# Patient Record
Sex: Female | Born: 1990 | Race: Black or African American | Hispanic: No | Marital: Single | Smoking: Former smoker
Health system: Southern US, Community
[De-identification: ages and names within clinical notes are randomized; demographics above are authoritative.]

## PROBLEM LIST (undated history)

## (undated) ENCOUNTER — Inpatient Hospital Stay (HOSPITAL_COMMUNITY): Payer: Self-pay

## (undated) DIAGNOSIS — Z789 Other specified health status: Secondary | ICD-10-CM

## (undated) HISTORY — PX: NO PAST SURGERIES: SHX2092

---

## 2016-12-21 ENCOUNTER — Encounter (HOSPITAL_COMMUNITY): Payer: Self-pay

## 2016-12-21 DIAGNOSIS — F17290 Nicotine dependence, other tobacco product, uncomplicated: Secondary | ICD-10-CM | POA: Insufficient documentation

## 2016-12-21 DIAGNOSIS — R1011 Right upper quadrant pain: Secondary | ICD-10-CM | POA: Insufficient documentation

## 2016-12-21 DIAGNOSIS — Z79899 Other long term (current) drug therapy: Secondary | ICD-10-CM | POA: Insufficient documentation

## 2016-12-21 NOTE — ED Triage Notes (Signed)
Pt endorses intermittent n/v and RLQ abd pain since the beginning of January. Pt denies any medical hx and does not take any medications. VSS.

## 2016-12-22 ENCOUNTER — Emergency Department (HOSPITAL_COMMUNITY)
Admission: EM | Admit: 2016-12-22 | Discharge: 2016-12-22 | Disposition: A | Discharge status: Home / Self Care | Payer: Self-pay | Attending: Emergency Medicine | Admitting: Emergency Medicine

## 2016-12-22 ENCOUNTER — Emergency Department (HOSPITAL_COMMUNITY): Payer: Self-pay

## 2016-12-22 DIAGNOSIS — R1011 Right upper quadrant pain: Secondary | ICD-10-CM

## 2016-12-22 LAB — URINALYSIS, ROUTINE W REFLEX MICROSCOPIC
BACTERIA UA: NONE SEEN
BILIRUBIN URINE: NEGATIVE
Glucose, UA: NEGATIVE mg/dL
KETONES UR: NEGATIVE mg/dL
LEUKOCYTES UA: NEGATIVE
Nitrite: NEGATIVE
Protein, ur: NEGATIVE mg/dL
Specific Gravity, Urine: 1.024 (ref 1.005–1.030)
pH: 6 (ref 5.0–8.0)

## 2016-12-22 LAB — CBC
HEMATOCRIT: 32.1 % — AB (ref 36.0–46.0)
Hemoglobin: 10.1 g/dL — ABNORMAL LOW (ref 12.0–15.0)
MCH: 23.9 pg — AB (ref 26.0–34.0)
MCHC: 31.5 g/dL (ref 30.0–36.0)
MCV: 75.9 fL — AB (ref 78.0–100.0)
PLATELETS: 261 10*3/uL (ref 150–400)
RBC: 4.23 MIL/uL (ref 3.87–5.11)
RDW: 16.8 % — ABNORMAL HIGH (ref 11.5–15.5)
WBC: 7.7 10*3/uL (ref 4.0–10.5)

## 2016-12-22 LAB — WET PREP, GENITAL
Sperm: NONE SEEN
TRICH WET PREP: NONE SEEN
YEAST WET PREP: NONE SEEN

## 2016-12-22 LAB — COMPREHENSIVE METABOLIC PANEL
ALBUMIN: 3.3 g/dL — AB (ref 3.5–5.0)
ALK PHOS: 82 U/L (ref 38–126)
ALT: 16 U/L (ref 14–54)
AST: 21 U/L (ref 15–41)
Anion gap: 9 (ref 5–15)
BUN: 7 mg/dL (ref 6–20)
CHLORIDE: 103 mmol/L (ref 101–111)
CO2: 25 mmol/L (ref 22–32)
CREATININE: 0.59 mg/dL (ref 0.44–1.00)
Calcium: 8.4 mg/dL — ABNORMAL LOW (ref 8.9–10.3)
Glucose, Bld: 113 mg/dL — ABNORMAL HIGH (ref 65–99)
POTASSIUM: 3.6 mmol/L (ref 3.5–5.1)
Sodium: 137 mmol/L (ref 135–145)
Total Bilirubin: 0.5 mg/dL (ref 0.3–1.2)
Total Protein: 6.1 g/dL — ABNORMAL LOW (ref 6.5–8.1)

## 2016-12-22 LAB — LIPASE, BLOOD: Lipase: 15 U/L (ref 11–51)

## 2016-12-22 LAB — HCG, QUANTITATIVE, PREGNANCY

## 2016-12-22 NOTE — ED Notes (Signed)
Pelvic cart setup for exam 

## 2016-12-22 NOTE — ED Notes (Signed)
Pt report abd pain and nausea since the beginning of January. Pt reports that she doesn't have time to go to a primary doctor for evaluation. Pt reports coming in tonight because she was sick of the issues and just wanted to get an ultrasound to see what is going on.

## 2016-12-22 NOTE — ED Provider Notes (Signed)
[facility]-EM[facility]ENCY DEPT Provider Note   CSN: [id number] Arrival date & time: 12/21/16  2333  By signing my name below, I, Octavia Heir, attest that this documentation has been prepared under the direction and in the presence of Tomasita Crumble, MD.  Electronically Signed: Octavia Heir, ED Scribe. 12/22/16. 3:07 AM.    History   Chief Complaint Chief Complaint  Patient presents with  . Abdominal Pain  . Emesis   The history is provided by the patient. No language interpreter was used.   Marland KitchenHPI Comments: Judy Collins is a 26 y.o. female who presents to the Emergency Department complaining of moderate, intermittent, LLQ abdominal pain for the past 3 months. She reports associated nausea, vomiting, and constipation. Pt has not been evaluated for her abdominal pain in the past. She has not taken any medication to alleviate her pain. Pt has been having normal menstrual cycles since January. Pt has no PMhx of fibroids or ovarian cysts. She denies diarrhea, hematuria, dysuria, and vaginal discharge.  History reviewed. No pertinent past medical history.  There are no active problems to display for this patient.   History reviewed. No pertinent surgical history.  OB History    No data available       Home Medications    Prior to Admission medications   Not on File    Family History History reviewed. No pertinent family history.  Social History Social History  Substance Use Topics  . Smoking status: Current Some Day Smoker    Types: Cigars  . Smokeless tobacco: Never Used  . Alcohol use Yes     Comment: occ     Allergies   Patient has no known allergies.   Review of Systems Review of Systems  A complete 10 system review of systems was obtained and all systems are negative except as noted in the HPI and PMH.   Physical Exam Updated Vital Signs BP 119/61 (BP Location: Right Arm)   Pulse 69   Temp 98 F (36.7 C) (Oral)   Resp 18   Ht  (1.702 m)   Wt 245 lb  (111.1 kg)   LMP 12/17/2016 (Exact Date)   SpO2 100%   BMI 38.37 kg/m   Physical Exam  Constitutional: She is oriented to person, place, and time. She appears well-developed and well-nourished. No distress.  HENT:  Head: Normocephalic and atraumatic.  Nose: Nose normal.  Mouth/Throat: Oropharynx is clear and moist. No oropharyngeal exudate.  Eyes: Conjunctivae and EOM are normal. Pupils are equal, round, and reactive to light. No scleral icterus.  Neck: Normal range of motion. Neck supple. No JVD present. No tracheal deviation present. No thyromegaly present.  Cardiovascular: Normal rate, regular rhythm and normal heart sounds.  Exam reveals no gallop and no friction rub.   No murmur heard. Pulmonary/Chest: Effort normal and breath sounds normal. No respiratory distress. She has no wheezes. She exhibits no tenderness.  Abdominal: Soft. Bowel sounds are normal. She exhibits no distension and no mass. There is tenderness (LLQ is TTP). There is no rebound and no guarding.  Musculoskeletal: Normal range of motion. She exhibits no edema or tenderness.  Lymphadenopathy:    She has no cervical adenopathy.  Neurological: She is alert and oriented to person, place, and time. No cranial nerve deficit. She exhibits normal muscle tone.  Skin: Skin is warm and dry. No rash noted. No erythema. No pallor.  Nursing note and vitals reviewed.    ED Treatments / Results  DIAGNOSTIC STUDIES: Oxygen  Saturation is 100% on RA, normal by my interpretation.  COORDINATION OF CARE:  2:54 AM Discussed treatment plan with pt at bedside and pt agreed to plan.  Labs (all labs ordered are listed, but only abnormal results are displayed) Labs Reviewed  WET PREP, GENITAL - Abnormal; Notable for the following:       Result Value   Clue Cells Wet Prep HPF POC PRESENT (*)    WBC, Wet Prep HPF POC MANY (*)    All other components within normal limits  COMPREHENSIVE METABOLIC PANEL - Abnormal; Notable for the  following:    Glucose, Bld 113 (*)    Calcium 8.4 (*)    Total Protein 6.1 (*)    Albumin 3.3 (*)    All other components within normal limits  CBC - Abnormal; Notable for the following:    Hemoglobin 10.1 (*)    HCT 32.1 (*)    MCV 75.9 (*)    MCH 23.9 (*)    RDW 16.8 (*)    All other components within normal limits  URINALYSIS, ROUTINE W REFLEX MICROSCOPIC - Abnormal; Notable for the following:    APPearance HAZY (*)    Hgb urine dipstick MODERATE (*)    Squamous Epithelial / LPF 0-5 (*)    All other components within normal limits  LIPASE, BLOOD  HCG, QUANTITATIVE, PREGNANCY  GC/CHLAMYDIA PROBE AMP ([place]) NOT AT [facility]    EKG  EKG Interpretation None       Radiology US Abdomen Limited Ruq  Result Date: 12/22/2016 CLINICAL DATA:  Right upper quadrant abdominal pain. Pain for 3 months. EXAM: US ABDOMEN LIMITED - RIGHT UPPER QUADRANT COMPARISON:  None. FINDINGS: Gallbladder: Physiologically distended. No gallstones or wall thickening visualized. No sonographic Murphy sign noted by sonographer. Diffuse right upper quadrant tenderness on scanning, not localized to the gallbladder. Common bile duct: Diameter: 3.2 mm. Liver: No focal lesion identified. Borderline mild diffuse increase in parenchymal echogenicity. Normal directional flow in the main portal vein. IMPRESSION: 1. Normal sonographic appearance of the gallbladder and biliary tree. No gallstones. 2. Borderline mild hepatic steatosis. Electronically Signed   By: Rubye Oaks M.D.   On: 12/22/2016 06:23    Procedures Procedures (including critical care time)  Medications Ordered in ED Medications - No data to display   Initial Impression / Assessment and Plan / ED Course  I have reviewed the triage vital signs and the nursing notes.  Pertinent labs & imaging results that were available during my care of the patient were reviewed by me and considered in my medical decision making (see chart for details).       Patient presents to the emergency department for abdominal pain. She has not sought treatment for this in the past. Laboratory studies are unremarkable. Pelvic exam does not reveal any discharge, no CMT, no adnexal tenderness. Exam is normal. She states her pain is in the left lower quadrant but now states that it is right upper quadrant. She states is worse with food now. Will obtain right upper quadrant ultrasound to evaluate for cholelithiasis.   6:53 AM ultrasound is negative for any gallbladder pathology. Patient continues to appear well and in no acute distress. She is advised to follow-up with primary care physician regarding her abdominal pain for the past 3 months. She douches consisting of this plan. Vital signs were within her normal limits. Patient is safe for discharge. Final Clinical Impressions(s) / ED Diagnoses   Final diagnoses:  RUQ abdominal pain  Right upper quadrant abdominal pain   I personally performed the services described in this documentation, which was scribed in my presence. The recorded information has been reviewed and is accurate.    New Prescriptions New Prescriptions   No medications on file     Tomasita Crumble, MD 12/22/16 [phone]

## 2016-12-24 LAB — GC/CHLAMYDIA PROBE AMP (~~LOC~~) NOT AT ARMC
CHLAMYDIA, DNA PROBE: NEGATIVE
NEISSERIA GONORRHEA: NEGATIVE

## 2017-09-24 NOTE — L&D Delivery Note (Addendum)
Patient is 27 y.o. G1P0 [redacted]w[redacted]d admitted for IOL for gHTN   Delivery Note At 12:57 PM a viable female was delivered via Vaginal, Spontaneous (Presentation: cephalic; ROA ).  APGAR: 9, 9; weight pending.   Placenta status: spontaneous, intact.  Cord: 3VC    Anesthesia:  epidural Episiotomy: None Lacerations: 2nd degree perineal, bilateral first degree periurethral Suture Repair: 2.0 3.0 vicryl rapide Est. Blood Loss (mL): 546  Mom to postpartum.  Baby to Couplet care / Skin to Skin.  Upon arrival patient was complete and pushing. FHT was noted to have repetitive variable decels. She pushed with good maternal effort to deliver a healthy baby girl. Baby delivered without difficulty, was noted to have good tone and place on maternal abdomen for oral suctioning, drying and stimulation. Delayed cord clamping performed. Placenta delivered intact with 3V cord. Vaginal canal and perineum was inspected and found to have bilateral first degree periurethral lacerations and a second degree perineal laceration all three lacerations required suture repair and were hemostatic after repair. Pitocin was started and uterus massaged until bleeding slowed. Counts of sharps, instruments, and lap pads were all correct.   Mirian Mo, MD PGY-1 9/6/20191:55 PM  Midwife attestation: I was gloved and present for delivery in its entirety and I agree with the above resident's note.  Donette Larry, CNM 3:02 PM

## 2017-12-04 ENCOUNTER — Other Ambulatory Visit: Payer: Self-pay

## 2017-12-04 ENCOUNTER — Inpatient Hospital Stay (HOSPITAL_COMMUNITY)
Admission: AD | Admit: 2017-12-04 | Discharge: 2017-12-04 | Disposition: A | Discharge status: Home / Self Care | Payer: Medicaid Other | Source: Ambulatory Visit | Attending: Obstetrics & Gynecology | Admitting: Obstetrics & Gynecology

## 2017-12-04 ENCOUNTER — Encounter (HOSPITAL_COMMUNITY): Payer: Self-pay | Admitting: *Deleted

## 2017-12-04 DIAGNOSIS — A5901 Trichomonal vulvovaginitis: Secondary | ICD-10-CM | POA: Diagnosis not present

## 2017-12-04 DIAGNOSIS — Z3A11 11 weeks gestation of pregnancy: Secondary | ICD-10-CM | POA: Diagnosis not present

## 2017-12-04 DIAGNOSIS — Z3491 Encounter for supervision of normal pregnancy, unspecified, first trimester: Secondary | ICD-10-CM

## 2017-12-04 DIAGNOSIS — O209 Hemorrhage in early pregnancy, unspecified: Secondary | ICD-10-CM | POA: Insufficient documentation

## 2017-12-04 DIAGNOSIS — Z87891 Personal history of nicotine dependence: Secondary | ICD-10-CM | POA: Diagnosis not present

## 2017-12-04 DIAGNOSIS — O23591 Infection of other part of genital tract in pregnancy, first trimester: Secondary | ICD-10-CM

## 2017-12-04 DIAGNOSIS — O98311 Other infections with a predominantly sexual mode of transmission complicating pregnancy, first trimester: Secondary | ICD-10-CM | POA: Insufficient documentation

## 2017-12-04 HISTORY — DX: Other specified health status: Z78.9

## 2017-12-04 LAB — URINALYSIS, ROUTINE W REFLEX MICROSCOPIC
BILIRUBIN URINE: NEGATIVE
GLUCOSE, UA: NEGATIVE mg/dL
Ketones, ur: NEGATIVE mg/dL
Nitrite: NEGATIVE
PROTEIN: NEGATIVE mg/dL
Specific Gravity, Urine: 1.008 (ref 1.005–1.030)
pH: 6 (ref 5.0–8.0)

## 2017-12-04 LAB — WET PREP, GENITAL
Clue Cells Wet Prep HPF POC: NONE SEEN
SPERM: NONE SEEN

## 2017-12-04 LAB — POCT PREGNANCY, URINE: PREG TEST UR: POSITIVE — AB

## 2017-12-04 LAB — ABO/RH: ABO/RH(D): O POS

## 2017-12-04 MED ORDER — METRONIDAZOLE 500 MG PO TABS: ORAL_TABLET | ORAL | 0 refills | Status: DC

## 2017-12-04 NOTE — MAU Provider Note (Signed)
Chief Complaint: Vaginal Bleeding   First Provider Initiated Contact with Patient 12/04/17 1335      SUBJECTIVE HPI: Judy Collins is a 27 y.o. G1P0 at 5353w6d by LMP who presents to maternity admissions [facility] for episodes of vaginal bleeding this week. She presented today to their office with positive HPT on 11/01/17 and bleeding x 2 episodes this week, none today. Pregnancy test was positive today at [facility].  She was sent to MAU for further evaluation of bleeding. She reports the first episode of bleeding was ~1 week ago, then it occurred again 4 days ago.  The bleeding was light, pink, and noted only when wiping. There are no other symptoms. She has not tried any treatments.  [place]. She has recently been approved for pregnancy Medicaid.  HPI  Past Medical History:  Diagnosis Date  . Medical history non-contributory    Past Surgical History:  Procedure Laterality Date  . NO PAST SURGERIES     Social History   Socioeconomic History  . Marital status: Single    Spouse name: Not on file  . Number of children: Not on file  . Years of education: Not on file  . Highest education level: Not on file  Social Needs  . Financial resource strain: Not on file  . Food insecurity - worry: Not on file  . Food insecurity - inability: Not on file  . Transportation needs - medical: Not on file  . Transportation needs - non-medical: Not on file  Occupational History  . Not on file  Tobacco Use  . Smoking status: Former Smoker    Types: Cigars  . Smokeless tobacco: Never Used  Substance and Sexual Activity  . Alcohol use: No    Frequency: Never    Comment: occ  . Drug use: No  . Sexual activity: Yes  Other Topics Concern  . Not on file  Social History Narrative  . Not on file   No current facility-administered medications on file prior to encounter.    No current outpatient medications on file prior to  encounter.   No Known Allergies  ROS:  Review of Systems  Constitutional: Negative for chills, fatigue and fever.  Respiratory: Negative for shortness of breath.   Cardiovascular: Negative for chest pain.  Gastrointestinal: Negative for nausea and vomiting.  Genitourinary: Positive for vaginal bleeding. Negative for difficulty urinating, dysuria, flank pain, pelvic pain, vaginal discharge and vaginal pain.  Neurological: Negative for dizziness and headaches.  Psychiatric/Behavioral: Negative.      I have reviewed patient's Past Medical Hx, Surgical Hx, Family Hx, Social Hx, medications and allergies.   Physical Exam   Patient Vitals for the past 24 hrs:  BP Temp Temp src Pulse Resp SpO2 Height Weight  12/04/17 1218 128/81 98.5 F (36.9 C) Oral 85 18 100 % - -  12/04/17 1213 - - - - - - 5\' 7"  (1.702 m) 276 lb 8 oz (125.4 kg)   Constitutional: Well-developed, well-nourished female in no acute distress.  Cardiovascular: normal rate Respiratory: normal effort GI: Abd soft, non-tender. Pos BS x 4 MS: Extremities nontender, no edema, normal ROM Neurologic: Alert and oriented x 4.  GU: Neg CVAT.  PELVIC EXAM: Cervix pink, visually closed, without lesion, scant white creamy discharge, vaginal walls and external genitalia normal Bimanual exam: Cervix 0/long/high, firm, anterior, neg CMT, uterus nontender, nonenlarged, adnexa without tenderness, enlargement, or mass  FHT  not heard by doppler  LAB RESULTS Results for orders placed or performed during the hospital encounter of 12/04/17 (from the past 24 hour(s))  Pregnancy, urine POC     Status: Abnormal   Collection Time: 12/04/17 12:38 PM  Result Value Ref Range   Preg Test, Ur POSITIVE (A) NEGATIVE       IMAGING Limited OB US Date: 12/04/17 EDD : 06/19/18  based on LMP Viability:  Live IUP noted.  FHT detected CRL measurement c/w LMP dates  MAU Management/MDM: Bedside US confirms IUP c/w sure LMP dates. Wet prep/GC  collected and positive for trichomonas. Pt needs to leave so Rx sent to pharmacy.  Pt declines expedited partner therapy.  Pt to f/u with prenatal care as soon as possible.  List of providers given.  Pt discharged with strict bleeding/first trimester precautions.  ASSESSMENT  1. Normal IUP (intrauterine pregnancy) on prenatal ultrasound, first trimester   2. Trichomonal vaginitis during pregnancy in first trimester   3. Vaginal bleeding in pregnancy, first trimester    PLAN Discharge home Allergies as of 12/04/2017   No Known Allergies     Medication List    You have not been prescribed any medications.      Sharen Counter Certified Nurse-Midwife 12/04/2017  1:37 PM

## 2017-12-04 NOTE — Discharge Instructions (Signed)
[facility]  ° ° °[facility]       Phone: [phone] ° °[facility] Phone: [phone] ° °[facility] [place]  Phone: [phone] ° °[facility] [place]  Phone: [phone] ° °[facility]  Phone: [phone] ° °[facility]       Phone: [phone] ° °[facility]    Phone: [phone]  ° °[facility]    Phone: [phone] ° °[facility]    Phone: [phone] ° °[facility]    Phone: [phone] ° °[facility]    Phone: [phone] ° °[facility]       Phone: [phone]  ° °[facility]  Phone: [phone] ° °[facility]    Phone: [phone] ° °[facility]   Phone: [phone] ° °[facility]      Phone: [phone] ° °[facility]    Phone: [phone] ° °

## 2017-12-04 NOTE — MAU Note (Signed)
[facility] instructed to be seen in [facility]U secondary   c/o VB x 1day (last Friday)  abdominal cramping.  +UPT @ [facility] today.  Denies current VB or cramping. LMP 09/12/2017

## 2017-12-05 LAB — GC/CHLAMYDIA PROBE AMP (~~LOC~~) NOT AT ARMC
Chlamydia: NEGATIVE
Neisseria Gonorrhea: NEGATIVE

## 2018-01-06 ENCOUNTER — Inpatient Hospital Stay (HOSPITAL_COMMUNITY)
Admission: AD | Admit: 2018-01-06 | Discharge: 2018-01-06 | Disposition: A | Discharge status: Home / Self Care | Payer: Medicaid Other | Source: Ambulatory Visit | Attending: Obstetrics & Gynecology | Admitting: Obstetrics & Gynecology

## 2018-01-06 ENCOUNTER — Encounter (HOSPITAL_COMMUNITY): Payer: Self-pay | Admitting: *Deleted

## 2018-01-06 DIAGNOSIS — Z87891 Personal history of nicotine dependence: Secondary | ICD-10-CM | POA: Insufficient documentation

## 2018-01-06 DIAGNOSIS — Z3A16 16 weeks gestation of pregnancy: Secondary | ICD-10-CM | POA: Diagnosis not present

## 2018-01-06 DIAGNOSIS — R51 Headache: Secondary | ICD-10-CM

## 2018-01-06 DIAGNOSIS — O26892 Other specified pregnancy related conditions, second trimester: Secondary | ICD-10-CM | POA: Diagnosis not present

## 2018-01-06 LAB — URINALYSIS, ROUTINE W REFLEX MICROSCOPIC
Bilirubin Urine: NEGATIVE
Glucose, UA: NEGATIVE mg/dL
Hgb urine dipstick: NEGATIVE
Ketones, ur: NEGATIVE mg/dL
LEUKOCYTES UA: NEGATIVE
NITRITE: NEGATIVE
PH: 7 (ref 5.0–8.0)
Protein, ur: NEGATIVE mg/dL
Specific Gravity, Urine: 1.017 (ref 1.005–1.030)

## 2018-01-06 MED ORDER — ACETAMINOPHEN 500 MG PO TABS
1000.0000 mg | ORAL_TABLET | Freq: Once | ORAL | Status: AC
  Administered 2018-01-06: 1000 mg via ORAL
  Filled 2018-01-06: qty 2

## 2018-01-06 NOTE — Discharge Instructions (Signed)
[facility]  ° ° °[facility]       Phone: [phone] ° °[facility] Phone: [phone] ° °[facility] [place]  Phone: [phone] ° °[facility] [place] [facility]  Phone: [phone] ° °[facility]  Phone: [phone] ° °[facility]       Phone: [phone] ° °[facility]    Phone: [phone]  ° °[facility]    Phone: [phone] ° °[facility]    Phone: [phone] ° °[facility]    Phone: [phone] ° °[facility]    Phone: [phone] ° °[facility]       Phone: [phone]  ° °[facility]  Phone: [phone] ° °[facility]    Phone: [phone] ° °[facility]   Phone: [phone] ° °[facility]      Phone: [phone] ° °[facility]    Phone: [phone] ° °Safe Medications in Pregnancy  ° °Acne: °Benzoyl Peroxide °Salicylic Acid ° °Backache/Headache: °Tylenol: 2 regular strength every 4 hours OR °             2 Extra strength every 6 hours ° °Colds/Coughs/Allergies: °Benadryl (alcohol free) 25 mg every 6 hours as needed °Breath right strips °Claritin °Cepacol throat lozenges °Chloraseptic throat spray °Cold-Eeze- up to three times per day °Cough drops, alcohol free °Flonase (by prescription only) °Guaifenesin °Mucinex °Robitussin DM (plain only, alcohol free) °Saline nasal spray/drops °Sudafed (pseudoephedrine) & Actifed ** use only after [redacted] weeks gestation and if you do not have high blood pressure °Tylenol °Vicks Vaporub °Zinc lozenges °Zyrtec  ° °Constipation: °Colace °Ducolax suppositories °Fleet enema °Glycerin suppositories °Metamucil °Milk of  magnesia °Miralax °Senokot °Smooth move tea ° °Diarrhea: °Kaopectate °Imodium A-D ° °*NO pepto Bismol ° °Hemorrhoids: °Anusol °Anusol HC °Preparation H °Tucks ° °Indigestion: °Tums °Maalox °Mylanta °Zantac  °Pepcid ° °Insomnia: °Benadryl (alcohol free) 25mg every 6 hours as needed °Tylenol PM °Unisom, no Gelcaps ° °Leg Cramps: °Tums °MagGel ° °Nausea/Vomiting:  °Bonine °Dramamine °Emetrol °Ginger extract °Sea bands °Meclizine  °Nausea medication to take during pregnancy:  °Unisom (doxylamine succinate 25 mg tablets) Take one tablet daily at bedtime. If symptoms are not adequately controlled, the dose can be increased to a maximum recommended dose of two tablets daily (1/2 tablet in the morning, 1/2 tablet mid-afternoon and one at bedtime). °Vitamin B6 100mg tablets. Take one tablet twice a day (up to 200 mg per day). ° °Skin Rashes: °Aveeno products °Benadryl cream or 25mg every 6 hours as needed °Calamine Lotion °1% cortisone cream ° °Yeast infection: °Gyne-lotrimin 7 °Monistat 7 ° ° °**If taking multiple medications, please check labels to avoid duplicating the same active ingredients °**take medication as directed on the label °** Do not exceed 4000 mg of tylenol in 24 hours °**Do not take medications that contain aspirin or ibuprofen ° ° ° ° °

## 2018-01-06 NOTE — MAU Note (Signed)
Pt here with c/o HA since about 1900; did not take anything for it because "I wasn't sure what I could take." Denies any bleeding or leaking.

## 2018-01-06 NOTE — MAU Note (Signed)
Pt reports headache since 7pm.

## 2018-01-06 NOTE — MAU Provider Note (Signed)
History     CSN: [id number]  Arrival date and time: 01/06/18 4010   First Provider Initiated Contact with Patient 01/06/18 0139      Chief Complaint  Patient presents with  . Headache   HPI Judy Collins is a 27 y.o. G1P0 at [redacted]w[redacted]d who presents with a headache. She reports a history of frequent headaches and states this one has been on and off all week. She reports the pain a 7/10 and did not try anything for the pain because "I don't know what I can take." She denies any abdominal pain or vaginal bleeding. Patient states she does not drink much water during the day.   OB History    Gravida  1   Para      Term      Preterm      AB      Living        SAB      TAB      Ectopic      Multiple      Live Births              Past Medical History:  Diagnosis Date  . Medical history non-contributory     Past Surgical History:  Procedure Laterality Date  . NO PAST SURGERIES      No family history on file.  Social History   Tobacco Use  . Smoking status: Former Smoker    Types: Cigars  . Smokeless tobacco: Never Used  Substance Use Topics  . Alcohol use: No    Frequency: Never    Comment: occ  . Drug use: No    Allergies: No Known Allergies  Medications Prior to Admission  Medication Sig Dispense Refill Last Dose  . metroNIDAZOLE (FLAGYL) 500 MG tablet Take two tablets by mouth twice a day, for one day.  Or you can take all four tablets at once if you can tolerate it. 4 tablet 0     Review of Systems  Constitutional: Negative.  Negative for fatigue and fever.  HENT: Negative.   Respiratory: Negative.  Negative for shortness of breath.   Cardiovascular: Negative.  Negative for chest pain.  Gastrointestinal: Negative.  Negative for abdominal pain, constipation, diarrhea, nausea and vomiting.  Genitourinary: Negative.  Negative for dysuria.  Neurological: Positive for headaches. Negative for dizziness.   Physical Exam   Blood pressure 140/87,  pulse 93, temperature 99.4 F (37.4 C), temperature source Oral, resp. rate 18, height 5\' 6"  (1.676 m), weight 277 lb (125.6 kg), last menstrual period 09/12/2017, SpO2 100 %.  Physical Exam  Nursing note and vitals reviewed. Constitutional: She is oriented to person, place, and time. She appears well-developed and well-nourished. No distress.  HENT:  Head: Normocephalic.  Eyes: Pupils are equal, round, and reactive to light.  Cardiovascular: Normal rate, regular rhythm and normal heart sounds.  Respiratory: Effort normal and breath sounds normal. No respiratory distress.  GI: Soft. Bowel sounds are normal. She exhibits no distension. There is no tenderness.  Neurological: She is alert and oriented to person, place, and time. She has normal reflexes. No cranial nerve deficit. Coordination normal.  Skin: Skin is warm and dry.  Psychiatric: She has a normal mood and affect. Her behavior is normal. Judgment and thought content normal.   FHT: 153 bpm  MAU Course  Procedures Results for orders placed or performed during the hospital encounter of 01/06/18 (from the past 24 hour(s))  Urinalysis, Routine w reflex microscopic  Status: None   Collection Time: 01/06/18 12:45 AM  Result Value Ref Range   Color, Urine YELLOW YELLOW   APPearance CLEAR CLEAR   Specific Gravity, Urine 1.017 1.005 - 1.030   pH 7.0 5.0 - 8.0   Glucose, UA NEGATIVE NEGATIVE mg/dL   Hgb urine dipstick NEGATIVE NEGATIVE   Bilirubin Urine NEGATIVE NEGATIVE   Ketones, ur NEGATIVE NEGATIVE mg/dL   Protein, ur NEGATIVE NEGATIVE mg/dL   Nitrite NEGATIVE NEGATIVE   Leukocytes, UA NEGATIVE NEGATIVE   MDM UA Tylenol 1000mg  PO Patient reports relief from headache  Assessment and Plan   1. Pregnancy headache in second trimester   2. [redacted] weeks gestation of pregnancy    -Discharge home in stable condition -Safe OTC medications in pregnancy list given to patient -Encouraged patient to increase fluid intake -Patient  advised to follow-up with OB of choice to start prenatal care -Patient may return to MAU as needed or if her condition were to change or worsen  Rolm BookbinderCaroline M Eduardo Wurth CNM 01/06/2018, 1:39 AM

## 2018-02-03 ENCOUNTER — Other Ambulatory Visit (HOSPITAL_COMMUNITY)
Admission: RE | Admit: 2018-02-03 | Discharge: 2018-02-03 | Disposition: A | Discharge status: Home / Self Care | Payer: Medicaid Other | Source: Ambulatory Visit | Attending: Obstetrics & Gynecology | Admitting: Obstetrics & Gynecology

## 2018-02-03 ENCOUNTER — Ambulatory Visit (INDEPENDENT_AMBULATORY_CARE_PROVIDER_SITE_OTHER): Payer: Medicaid Other | Admitting: Advanced Practice Midwife

## 2018-02-03 ENCOUNTER — Encounter: Payer: Self-pay | Admitting: Advanced Practice Midwife

## 2018-02-03 VITALS — BP 124/80 | Wt 275.0 lb

## 2018-02-03 DIAGNOSIS — B373 Candidiasis of vulva and vagina: Secondary | ICD-10-CM | POA: Insufficient documentation

## 2018-02-03 DIAGNOSIS — Z3481 Encounter for supervision of other normal pregnancy, first trimester: Secondary | ICD-10-CM

## 2018-02-03 DIAGNOSIS — Z3402 Encounter for supervision of normal first pregnancy, second trimester: Secondary | ICD-10-CM

## 2018-02-03 DIAGNOSIS — Z3400 Encounter for supervision of normal first pregnancy, unspecified trimester: Secondary | ICD-10-CM | POA: Diagnosis present

## 2018-02-03 DIAGNOSIS — B9689 Other specified bacterial agents as the cause of diseases classified elsewhere: Secondary | ICD-10-CM | POA: Insufficient documentation

## 2018-02-03 DIAGNOSIS — O99212 Obesity complicating pregnancy, second trimester: Secondary | ICD-10-CM

## 2018-02-03 DIAGNOSIS — O99210 Obesity complicating pregnancy, unspecified trimester: Secondary | ICD-10-CM

## 2018-02-03 DIAGNOSIS — E669 Obesity, unspecified: Secondary | ICD-10-CM

## 2018-02-03 NOTE — Progress Notes (Signed)
   PRENATAL VISIT NOTE  Subjective:  Judy Collins is a 27 y.o. G1P0 who is 20 weeks by unsure LMP being seen today for initial prenatal visit.  She is currently monitored for the following issues for this low-risk pregnancy and has Obesity in pregnancy and Supervision of normal first pregnancy, antepartum on their problem list.  Patient reports no complaints.  Contractions: Not present. Vag. Bleeding: None.  Movement: Present. Denies leaking of fluid.   The following portions of the patient's history were reviewed and updated as appropriate: allergies, current medications, past family history, past medical history, past social history, past surgical history and problem list. Problem list updated.  Objective:   Vitals:   02/03/18 1049  BP: 124/80  Weight: 275 lb (124.7 kg)    Fetal Status: Fetal Heart Rate (bpm): 154   Movement: Present     VS reviewed, nursing note reviewed,  Constitutional: well developed, well nourished, no distress HEENT: normocephalic CV: normal rate Pulm/chest wall: normal effort Breast Exam:  right breast normal without mass, skin or nipple changes or axillary nodes, left breast normal without mass, skin or nipple changes or axillary nodes Abdomen: soft Neuro: alert and oriented x 3 Skin: warm, dry Psych: affect normal Pelvic exam: Cervix pink, visually closed, without lesion, scant white creamy discharge, vaginal walls and external genitalia normal Bimanual exam: Cervix 0/long/high, firm, anterior, neg CMT, uterus nontender, enlarged, adnexa without tenderness, enlargement, or mass  Assessment and Plan:  Pregnancy: G1P0 at Unknown  1. Supervision of normal first pregnancy, antepartum --Discussed safety in pregnancy, anticipatory guidance given about next weeks of pregnancy/next prenatal visits. --Unsure dates, anatomy US scheduled next week - Prenatal Vit-Fe Fumarate-FA (MULTIVITAMIN-PRENATAL) 27-0.8 MG TABS tablet; Take 1 tablet by mouth daily at 12  noon. - Obstetric panel - HIV antibody (with reflex) - Cystic fibrosis diagnostic study - Sickle Cell Scr - Culture, OB Urine - [place] MFM OB COMP + 14 WK; Future - Cytology - PAP  2. Obesity in pregnancy --Recommend 10-15 lbs of weight gain  Preterm labor symptoms and general obstetric precautions including but not limited to vaginal bleeding, contractions, leaking of fluid and fetal movement were reviewed in detail with the patient. Please refer to After Visit Summary for other counseling recommendations.  No follow-ups on file.  Future Appointments  Date Time Provider Department Center  02/11/2018  3:45 PM [facility] [place] 2 [facility]  03/03/2018 11:00 AM Leftwich-Kirby, Wilmer Floor, CNM [facility]    Sharen Counter, CNM

## 2018-02-04 LAB — CYTOLOGY - PAP
BACTERIAL VAGINITIS: POSITIVE — AB
Candida vaginitis: POSITIVE — AB
Chlamydia: NEGATIVE
Diagnosis: NEGATIVE
Neisseria Gonorrhea: NEGATIVE

## 2018-02-06 ENCOUNTER — Telehealth: Payer: Self-pay

## 2018-02-06 DIAGNOSIS — B9689 Other specified bacterial agents as the cause of diseases classified elsewhere: Secondary | ICD-10-CM

## 2018-02-06 DIAGNOSIS — B379 Candidiasis, unspecified: Secondary | ICD-10-CM

## 2018-02-06 DIAGNOSIS — N760 Acute vaginitis: Secondary | ICD-10-CM

## 2018-02-06 MED ORDER — TERCONAZOLE 0.4 % VA CREA
1.0000 | TOPICAL_CREAM | Freq: Every day | VAGINAL | 0 refills | Status: DC
Start: 2018-02-06 — End: 2018-05-02

## 2018-02-06 MED ORDER — METRONIDAZOLE 500 MG PO TABS: 500.0000 mg | ORAL_TABLET | Freq: Two times a day (BID) | ORAL | 0 refills | Status: DC

## 2018-02-06 NOTE — Telephone Encounter (Signed)
Spoke with pt about anemia and to add iron supplement in addition to her prenatal vitamins per Sharen Counter, CNM. Also told her to increase PO fluids and fiber or Colace if it causes constipation. Also spoke with pt about positive yeast and BV results. Pharmacy verified. Rx sent. Will treat per protocol. Pt expressed understanding.

## 2018-02-07 LAB — CULTURE, OB URINE

## 2018-02-07 LAB — URINE CULTURE, OB REFLEX

## 2018-02-11 ENCOUNTER — Ambulatory Visit (HOSPITAL_COMMUNITY): Payer: Medicaid Other

## 2018-02-11 ENCOUNTER — Other Ambulatory Visit: Payer: Self-pay | Admitting: Advanced Practice Midwife

## 2018-02-11 ENCOUNTER — Ambulatory Visit (HOSPITAL_COMMUNITY)
Admission: RE | Admit: 2018-02-11 | Discharge: 2018-02-11 | Disposition: A | Discharge status: Home / Self Care | Payer: Medicaid Other | Source: Ambulatory Visit | Attending: Advanced Practice Midwife | Admitting: Advanced Practice Midwife

## 2018-02-11 DIAGNOSIS — O99212 Obesity complicating pregnancy, second trimester: Secondary | ICD-10-CM | POA: Insufficient documentation

## 2018-02-11 DIAGNOSIS — O0932 Supervision of pregnancy with insufficient antenatal care, second trimester: Secondary | ICD-10-CM | POA: Insufficient documentation

## 2018-02-11 DIAGNOSIS — Z3687 Encounter for antenatal screening for uncertain dates: Secondary | ICD-10-CM | POA: Insufficient documentation

## 2018-02-11 DIAGNOSIS — Z3A21 21 weeks gestation of pregnancy: Secondary | ICD-10-CM | POA: Diagnosis not present

## 2018-02-11 DIAGNOSIS — Z3492 Encounter for supervision of normal pregnancy, unspecified, second trimester: Secondary | ICD-10-CM

## 2018-02-11 DIAGNOSIS — Z3400 Encounter for supervision of normal first pregnancy, unspecified trimester: Secondary | ICD-10-CM

## 2018-02-11 DIAGNOSIS — Z363 Encounter for antenatal screening for malformations: Secondary | ICD-10-CM | POA: Insufficient documentation

## 2018-02-11 LAB — CYSTIC FIBROSIS DIAGNOSTIC STUDY

## 2018-02-11 LAB — OBSTETRIC PANEL
Antibody Screen: NOT DETECTED
Basophils Absolute: 29 cells/uL (ref 0–200)
Basophils Relative: 0.5 %
EOS PCT: 0.3 %
Eosinophils Absolute: 17 cells/uL (ref 15–500)
HCT: 29.9 % — ABNORMAL LOW (ref 35.0–45.0)
HEP B S AG: NONREACTIVE
Hemoglobin: 9.6 g/dL — ABNORMAL LOW (ref 11.7–15.5)
LYMPHS ABS: 1398 {cells}/uL (ref 850–3900)
MCH: 23.2 pg — ABNORMAL LOW (ref 27.0–33.0)
MCHC: 32.1 g/dL (ref 32.0–36.0)
MCV: 72.4 fL — AB (ref 80.0–100.0)
MPV: 11.3 fL (ref 7.5–12.5)
Monocytes Relative: 10.4 %
Neutro Abs: 3753 cells/uL (ref 1500–7800)
Neutrophils Relative %: 64.7 %
PLATELETS: 257 10*3/uL (ref 140–400)
RBC: 4.13 10*6/uL (ref 3.80–5.10)
RDW: 18.5 % — ABNORMAL HIGH (ref 11.0–15.0)
RPR Ser Ql: NONREACTIVE
Rubella: 3.67 index
Total Lymphocyte: 24.1 %
WBC mixed population: 603 cells/uL (ref 200–950)
WBC: 5.8 10*3/uL (ref 3.8–10.8)

## 2018-02-11 LAB — SICKLE CELL SCREEN: Sickle Solubility Test - HGBRFX: NEGATIVE

## 2018-02-11 LAB — HIV ANTIBODY (ROUTINE TESTING W REFLEX): HIV: NONREACTIVE

## 2018-03-03 ENCOUNTER — Encounter: Payer: Medicaid Other | Admitting: Advanced Practice Midwife

## 2018-03-06 ENCOUNTER — Ambulatory Visit (INDEPENDENT_AMBULATORY_CARE_PROVIDER_SITE_OTHER): Payer: Medicaid Other | Admitting: Obstetrics & Gynecology

## 2018-03-06 VITALS — BP 122/84 | Wt 275.0 lb

## 2018-03-06 DIAGNOSIS — Z3402 Encounter for supervision of normal first pregnancy, second trimester: Secondary | ICD-10-CM

## 2018-03-06 DIAGNOSIS — O99212 Obesity complicating pregnancy, second trimester: Secondary | ICD-10-CM

## 2018-03-06 DIAGNOSIS — Z3400 Encounter for supervision of normal first pregnancy, unspecified trimester: Secondary | ICD-10-CM

## 2018-03-06 DIAGNOSIS — O99210 Obesity complicating pregnancy, unspecified trimester: Secondary | ICD-10-CM

## 2018-03-06 NOTE — Progress Notes (Signed)
   PRENATAL VISIT NOTE  Subjective:  Judy Collins is a 27 y.o. G1P0 at 1955w0d being seen today for ongoing prenatal care.  She is currently monitored for the following issues for this low-risk pregnancy and has Obesity in pregnancy and Supervision of normal first pregnancy, antepartum on their problem list.  Patient reports no complaints.  Contractions: Not present. Vag. Bleeding: None.  Movement: Present. Denies leaking of fluid.   The following portions of the patient's history were reviewed and updated as appropriate: allergies, current medications, past family history, past medical history, past social history, past surgical history and problem list. Problem list updated.  Objective:   Vitals:   03/06/18 1539  BP: 122/84  Weight: 275 lb (124.7 kg)    Fetal Status: Fetal Heart Rate (bpm): 151   Movement: Present     General:  Alert, oriented and cooperative. Patient is in no acute distress.  Skin: Skin is warm and dry. No rash noted.   Cardiovascular: Normal heart rate noted  Respiratory: Normal respiratory effort, no problems with respiration noted  Abdomen: Soft, gravid, appropriate for gestational age.  Pain/Pressure: Absent     Pelvic: Cervical exam deferred        Extremities: Normal range of motion.  Edema: None  Mental Status: Normal mood and affect. Normal behavior. Normal judgment and thought content.   Assessment and Plan:  Pregnancy: G1P0 at 2655w0d  1. Obesity in pregnancy  - Hemoglobin A1c  2. Supervision of normal first pregnancy, antepartum rec iron daily  Preterm labor symptoms and general obstetric precautions including but not limited to vaginal bleeding, contractions, leaking of fluid and fetal movement were reviewed in detail with the patient. Please refer to After Visit Summary for other counseling recommendations.  Return in about 2 weeks (around 03/20/2018) for 2 hour GTT.  Future Appointments  Date Time Provider Department Center  03/19/2018  9:00 AM  Allie Bossierove, Juanjose Mojica C, MD [facility]    Allie BossierMyra C Pratik Dalziel, MD

## 2018-03-06 NOTE — Progress Notes (Signed)
   PRENATAL VISIT NOTE  Subjective:  Judy Collins is a 27 y.o. single G1P0 Robb Matar( Kaliyah)  at 618w0d being seen today for ongoing prenatal care.  She is currently monitored for the following issues for this low-risk pregnancy and has Obesity in pregnancy and Supervision of normal first pregnancy, antepartum on their problem list.  Patient reports no complaints.  Contractions: Not present. Vag. Bleeding: None.  Movement: Present. Denies leaking of fluid.   The following portions of the patient's history were reviewed and updated as appropriate: allergies, current medications, past family history, past medical history, past social history, past surgical history and problem list. Problem list updated.  Objective:   Vitals:   03/06/18 1539  BP: 122/84  Weight: 275 lb (124.7 kg)    Fetal Status: Fetal Heart Rate (bpm): 151   Movement: Present     General:  Alert, oriented and cooperative. Patient is in no acute distress.  Skin: Skin is warm and dry. No rash noted.   Cardiovascular: Normal heart rate noted  Respiratory: Normal respiratory effort, no problems with respiration noted  Abdomen: Soft, gravid, appropriate for gestational age.  Pain/Pressure: Absent     Pelvic: Cervical exam deferred        Extremities: Normal range of motion.  Edema: None  Mental Status: Normal mood and affect. Normal behavior. Normal judgment and thought content.   Assessment and Plan:  Pregnancy: G1P0 at 628w0d  1. Obesity in pregnancy  - Hemoglobin A1c  2. Supervision of normal first pregnancy, antepartum - 2 hour GTT at next visit  Preterm labor symptoms and general obstetric precautions including but not limited to vaginal bleeding, contractions, leaking of fluid and fetal movement were reviewed in detail with the patient. Please refer to After Visit Summary for other counseling recommendations.  Return in about 2 weeks (around 03/20/2018) for 2 hour GTT.  Future Appointments  Date Time Provider  Department Center  03/19/2018  9:00 AM Allie Bossierove, Neiva Maenza C, MD [facility]    Allie BossierMyra C Jahking Lesser, MD

## 2018-03-07 LAB — HEMOGLOBIN A1C
Hgb A1c MFr Bld: 5.6 % of total Hgb (ref <5.7)
Mean Plasma Glucose: 114 (calc)
eAG (mmol/L): 6.3 (calc)

## 2018-03-19 ENCOUNTER — Encounter: Payer: Medicaid Other | Admitting: Obstetrics & Gynecology

## 2018-03-26 ENCOUNTER — Ambulatory Visit (INDEPENDENT_AMBULATORY_CARE_PROVIDER_SITE_OTHER): Payer: Medicaid Other | Admitting: Obstetrics and Gynecology

## 2018-03-26 ENCOUNTER — Encounter: Payer: Self-pay | Admitting: Obstetrics and Gynecology

## 2018-03-26 VITALS — BP 127/83 | HR 104 | Wt 277.0 lb

## 2018-03-26 DIAGNOSIS — O99212 Obesity complicating pregnancy, second trimester: Secondary | ICD-10-CM

## 2018-03-26 DIAGNOSIS — E669 Obesity, unspecified: Secondary | ICD-10-CM

## 2018-03-26 DIAGNOSIS — O99210 Obesity complicating pregnancy, unspecified trimester: Secondary | ICD-10-CM

## 2018-03-26 DIAGNOSIS — Z3402 Encounter for supervision of normal first pregnancy, second trimester: Secondary | ICD-10-CM

## 2018-03-26 DIAGNOSIS — Z3400 Encounter for supervision of normal first pregnancy, unspecified trimester: Secondary | ICD-10-CM

## 2018-03-26 NOTE — Progress Notes (Signed)
PT is undecided about Tdap

## 2018-03-26 NOTE — Progress Notes (Signed)
   PRENATAL VISIT NOTE  Subjective:  Judy Collins is a 27 y.o. G1P0 at 2250w6d being seen today for ongoing prenatal care.  She is currently monitored for the following issues for this low-risk pregnancy and has Obesity in pregnancy and Supervision of normal first pregnancy, antepartum on their problem list.  Patient reports no complaints.  Contractions: Not present. Vag. Bleeding: None.  Movement: Present. Denies leaking of fluid.   The following portions of the patient's history were reviewed and updated as appropriate: allergies, current medications, past family history, past medical history, past social history, past surgical history and problem list. Problem list updated.  Objective:   Vitals:   03/26/18 0859  BP: 127/83  Pulse: (!) 104  Weight: 277 lb (125.6 kg)    Fetal Status: Fetal Heart Rate (bpm): 147 Fundal Height: 28 cm Movement: Present     General:  Alert, oriented and cooperative. Patient is in no acute distress.  Skin: Skin is warm and dry. No rash noted.   Cardiovascular: Normal heart rate noted  Respiratory: Normal respiratory effort, no problems with respiration noted  Abdomen: Soft, gravid, appropriate for gestational age.  Pain/Pressure: Absent     Pelvic: Cervical exam deferred        Extremities: Normal range of motion.  Edema: None  Mental Status: Normal mood and affect. Normal behavior. Normal judgment and thought content.   Assessment and Plan:  Pregnancy: G1P0 at 4950w6d  1. Supervision of normal first pregnancy, antepartum Patient is doing well without complaints Third trimester labs today Follow up ultrasound ordered - HIV antibody (with reflex) - CBC - RPR - 2Hr GTT w/ 1 Hr Carpenter 75 g - Tdap vaccine greater than or equal to 7yo IM - US MFM OB FOLLOW UP; Future  2. Obesity in pregnancy   Preterm labor symptoms and general obstetric precautions including but not limited to vaginal bleeding, contractions, leaking of fluid and fetal movement  were reviewed in detail with the patient. Please refer to After Visit Summary for other counseling recommendations.  Return in about 2 weeks (around 04/09/2018) for ROB.  No future appointments.  Catalina AntiguaPeggy Oran Dillenburg, MD

## 2018-03-28 ENCOUNTER — Encounter: Payer: Self-pay | Admitting: Obstetrics and Gynecology

## 2018-03-28 DIAGNOSIS — O99013 Anemia complicating pregnancy, third trimester: Secondary | ICD-10-CM | POA: Insufficient documentation

## 2018-03-28 LAB — CBC
HCT: 28.7 % — ABNORMAL LOW (ref 35.0–45.0)
HEMOGLOBIN: 8.9 g/dL — AB (ref 11.7–15.5)
MCH: 22.9 pg — AB (ref 27.0–33.0)
MCHC: 31 g/dL — ABNORMAL LOW (ref 32.0–36.0)
MCV: 74 fL — AB (ref 80.0–100.0)
MPV: 11.1 fL (ref 7.5–12.5)
Platelets: 198 10*3/uL (ref 140–400)
RBC: 3.88 10*6/uL (ref 3.80–5.10)
RDW: 17.1 % — ABNORMAL HIGH (ref 11.0–15.0)
WBC: 5.8 10*3/uL (ref 3.8–10.8)

## 2018-03-28 LAB — 2HR GTT W 1 HR, CARPENTER, 75 G
Glucose, 1 Hr, Gest: 103 mg/dL (ref 65–179)
Glucose, 2 Hr, Gest: 99 mg/dL (ref 65–152)
Glucose, Fasting, Gest: 85 mg/dL (ref 65–91)

## 2018-03-28 LAB — RPR: RPR: NONREACTIVE

## 2018-03-28 LAB — HIV ANTIBODY (ROUTINE TESTING W REFLEX): HIV 1&2 Ab, 4th Generation: NONREACTIVE

## 2018-04-09 ENCOUNTER — Encounter: Payer: Medicaid Other | Admitting: Obstetrics & Gynecology

## 2018-04-10 ENCOUNTER — Ambulatory Visit (INDEPENDENT_AMBULATORY_CARE_PROVIDER_SITE_OTHER): Payer: Medicaid Other | Admitting: Obstetrics & Gynecology

## 2018-04-10 VITALS — BP 118/70 | Wt 277.0 lb

## 2018-04-10 DIAGNOSIS — O99210 Obesity complicating pregnancy, unspecified trimester: Secondary | ICD-10-CM

## 2018-04-10 DIAGNOSIS — Z3400 Encounter for supervision of normal first pregnancy, unspecified trimester: Secondary | ICD-10-CM

## 2018-04-10 NOTE — Progress Notes (Signed)
   PRENATAL VISIT NOTE  Subjective:  Judy Collins is a 27 y.o. G1P0 at 3131w0d being seen today for ongoing prenatal care.  She is currently monitored for the following issues for this low-risk pregnancy and has Obesity in pregnancy; Supervision of normal first pregnancy, antepartum; and Anemia in pregnancy, third trimester on their problem list.  Patient reports no complaints.  Contractions: Not present. Vag. Bleeding: None.  Movement: Present. Denies leaking of fluid.   The following portions of the patient's history were reviewed and updated as appropriate: allergies, current medications, past family history, past medical history, past social history, past surgical history and problem list. Problem list updated.  Objective:   Vitals:   04/10/18 1512  Weight: 277 lb (125.6 kg)    Fetal Status: Fetal Heart Rate (bpm): 147 Fundal Height: 36 cm Movement: Present     General:  Alert, oriented and cooperative. Patient is in no acute distress.  Skin: Skin is warm and dry. No rash noted.   Cardiovascular: Normal heart rate noted  Respiratory: Normal respiratory effort, no problems with respiration noted  Abdomen: Soft, gravid, appropriate for gestational age.  Pain/Pressure: Absent     Pelvic: Cervical exam deferred        Extremities: Normal range of motion.  Edema: None  Mental Status: Normal mood and affect. Normal behavior. Normal judgment and thought content.   Assessment and Plan:  Pregnancy: G1P0 at 6631w0d  1. Obesity in pregnancy - doing great with her weight - repeat MFM u/s to be scheduled  2. Supervision of normal first pregnancy, antepartum - I gave her a sample of iron pills for daily oral use and [facility] (2 sessions)  Preterm labor symptoms and general obstetric precautions including but not limited to vaginal bleeding, contractions, leaking of fluid and fetal movement were reviewed in detail with the patient. Please  refer to After Visit Summary for other counseling recommendations.  Return in about 2 weeks (around 04/24/2018).  Future Appointments  Date Time Provider Department Center  04/18/2018 12:00 PM [facility] ROOM 7 [facility] None    Allie BossierMyra C Arrion Broaddus, MD

## 2018-04-17 ENCOUNTER — Other Ambulatory Visit (HOSPITAL_COMMUNITY): Payer: Self-pay

## 2018-04-18 ENCOUNTER — Ambulatory Visit (HOSPITAL_COMMUNITY)
Admission: RE | Admit: 2018-04-18 | Discharge: 2018-04-18 | Disposition: A | Discharge status: Home / Self Care | Payer: Medicaid Other | Source: Ambulatory Visit | Attending: Obstetrics and Gynecology | Admitting: Obstetrics and Gynecology

## 2018-04-18 DIAGNOSIS — O99013 Anemia complicating pregnancy, third trimester: Secondary | ICD-10-CM | POA: Insufficient documentation

## 2018-04-18 DIAGNOSIS — Z3A00 Weeks of gestation of pregnancy not specified: Secondary | ICD-10-CM | POA: Diagnosis not present

## 2018-04-18 MED ORDER — SODIUM CHLORIDE 0.9 % IV SOLN
510.0000 mg | INTRAVENOUS | Status: DC
  Administered 2018-04-18: 510 mg via INTRAVENOUS
  Filled 2018-04-18: qty 17

## 2018-04-18 NOTE — Discharge Instructions (Signed)

## 2018-04-24 NOTE — Progress Notes (Deleted)
   PRENATAL VISIT NOTE  Subjective:  Judy Collins is a 27 y.o. G1P0 at 4222w0d being seen today for ongoing prenatal care.  She is currently monitored for the following issues for this low-risk pregnancy and has Obesity in pregnancy; Supervision of normal first pregnancy, antepartum; and Anemia in pregnancy, third trimester on their problem list.  Patient reports {sx:[id number]}.   .  .   . Denies leaking of fluid.   The following portions of the patient's history were reviewed and updated as appropriate: allergies, current medications, past family history, past medical history, past social history, past surgical history and problem list. Problem list updated.  Objective:  There were no vitals filed for this visit.  Fetal Status:           General:  Alert, oriented and cooperative. Patient is in no acute distress.  Skin: Skin is warm and dry. No rash noted.   Cardiovascular: Normal heart rate noted  Respiratory: Normal respiratory effort, no problems with respiration noted  Abdomen: Soft, gravid, appropriate for gestational age.        Pelvic: {Blank single:19197::"Cervical exam performed","Cervical exam deferred"}        Extremities: Normal range of motion.     Mental Status: Normal mood and affect. Normal behavior. Normal judgment and thought content.   Assessment and Plan:  Pregnancy: G1P0 at 5222w0d  1. Supervision of normal first pregnancy, antepartum - F/U 2 weeks - F/U US for incomplete anatomy 8/12  2. Anemia in pregnancy, third trimester - Feraheme #2 8/2 - Repeat CBC at [place]  Preterm labor symptoms and general obstetric precautions including but not limited to vaginal bleeding, contractions, leaking of fluid and fetal movement were reviewed in detail with the patient. Please refer to After Visit Summary for other counseling recommendations.  No follow-ups on file.  Future Appointments  Date Time Provider Department Center  04/25/2018 10:10 AM Dorathy KinsmanSmith, Chelsa Stout, CNM [facility]  04/25/2018 11:00 AM [facility] ROOM 8 [facility] None  05/05/2018  9:30 AM [facility] US 1 [facility]    Dorathy KinsmanVirginia Jalesha Plotz, [place]

## 2018-04-25 ENCOUNTER — Telehealth: Payer: Self-pay | Admitting: *Deleted

## 2018-04-25 ENCOUNTER — Ambulatory Visit (HOSPITAL_COMMUNITY)
Admission: RE | Admit: 2018-04-25 | Discharge: 2018-04-25 | Disposition: A | Discharge status: Home / Self Care | Payer: Medicaid Other | Source: Ambulatory Visit | Attending: Obstetrics and Gynecology | Admitting: Obstetrics and Gynecology

## 2018-04-25 ENCOUNTER — Encounter: Payer: Medicaid Other | Admitting: Advanced Practice Midwife

## 2018-04-25 DIAGNOSIS — O99013 Anemia complicating pregnancy, third trimester: Secondary | ICD-10-CM | POA: Diagnosis present

## 2018-04-25 MED ORDER — SODIUM CHLORIDE 0.9 % IV SOLN
510.0000 mg | INTRAVENOUS | Status: DC
  Administered 2018-04-25: 510 mg via INTRAVENOUS
  Filled 2018-04-25: qty 17

## 2018-04-25 NOTE — Telephone Encounter (Signed)
Left a message for patient to call and reschedule ROB 2 week NO SHOW appointment on 04/25/18.

## 2018-05-02 ENCOUNTER — Ambulatory Visit (INDEPENDENT_AMBULATORY_CARE_PROVIDER_SITE_OTHER): Payer: Medicaid Other | Admitting: Advanced Practice Midwife

## 2018-05-02 ENCOUNTER — Encounter: Payer: Self-pay | Admitting: Advanced Practice Midwife

## 2018-05-02 VITALS — BP 132/84 | HR 100 | Wt 279.0 lb

## 2018-05-02 DIAGNOSIS — O99013 Anemia complicating pregnancy, third trimester: Secondary | ICD-10-CM

## 2018-05-02 DIAGNOSIS — Z3400 Encounter for supervision of normal first pregnancy, unspecified trimester: Secondary | ICD-10-CM

## 2018-05-02 LAB — CBC
HEMATOCRIT: 32.1 % — AB (ref 35.0–45.0)
Hemoglobin: 10 g/dL — ABNORMAL LOW (ref 11.7–15.5)
MCH: 24.3 pg — ABNORMAL LOW (ref 27.0–33.0)
MCHC: 31.2 g/dL — AB (ref 32.0–36.0)
MCV: 77.9 fL — AB (ref 80.0–100.0)
Platelets: 185 10*3/uL (ref 140–400)
RBC: 4.12 10*6/uL (ref 3.80–5.10)
RDW: 22.1 % — AB (ref 11.0–15.0)
WBC: 5.6 10*3/uL (ref 3.8–10.8)

## 2018-05-02 LAB — POCT URINALYSIS DIPSTICK OB
GLUCOSE, UA: NEGATIVE — AB
POC,PROTEIN,UA: NEGATIVE

## 2018-05-02 NOTE — Patient Instructions (Addendum)
[email or web address]   AREA PEDIATRIC/FAMILY PRACTICE PHYSICIANS  [facility] [address], [address] [place], [place]  [address] Phone - [phone]   Fax - [phone]  [facility] [address] [place], [place] [address] Phone - [phone]   Fax - [phone]  JACK AMOS [address] [place], [place]  [address] Phone - [phone]   Fax - [phone]  [facility] [address] [place], [place]  [address] Phone - [phone]   Fax - [phone]  [facility] [address] [place], [place]  [address] Phone - [phone]   Fax - [phone]  [facility] [address], [address] [place], [place]  [address] Phone - [phone]   Fax - [phone]  [facility] [address], [address] [place], [place]  [address] Phone - [phone]   Fax - [phone]  [facility] [address] [place], [place]  [address] Phone - [phone]   Fax - [phone]  [facility] [address] [place], [place]  [address] Phone - [phone]   Fax - [phone] [facility] [address] [place], [place]  [address] Phone - [phone]   Fax - [phone]  [facility] [address] [place], [place]  [address] Phone - [phone]   Fax - [phone]  [facility] [address], [address] [place] [place], [place]  [address] Phone - [phone]   Fax - [phone]  [facility] [address], [address] [place], [place]  [address] Phone - [phone]   Fax - [phone]  [facility] [address], [address] [place], [place]  [address] Phone - [phone]  [facility] [address] [place], [place]  [address] Phone - [phone]   Fax - [phone]  [facility] [address], [address] [place], [place]  [address] Phone - [phone]   Fax - [phone]  [facility] [address] [place], [place]  [address] Phone - [phone]   Fax - [phone]  [facility] [address] [place], [place]  [address] Phone - [phone]   Fax - [phone]   [facility] [address] [place], [place]  [address] Phone - [phone]   Fax - [phone]  [facility] [address], [address] [place], [place]  [address] Phone - [phone]   Fax - [phone]  DAVID RUBIN [address] [place], [place]  [address] Phone - [phone]   Fax - [phone]  [facility] [address] [place], [place]  [address] Phone - [phone]   Fax - [phone]  [facility] [address] [place], [place]  [address] Phone - [phone]   Fax - [phone] [facility] [place] [address] [place], [place]  [address] Phone - [phone]   Fax - [phone]  [facility] [address] [place], [place]  [address] Phone - [phone]   Fax - [phone]  [facility] [place] [address] [place], [place]  [address] Phone - [phone]   Fax - [phone]  [facility] Wyvonne Lenzharlene Flemming MD [address] [place] [address] Phone [phone]  Fax [phone]  TDaP Vaccine Pregnancy Get the Whooping Cough Vaccine While You Are Pregnant (  CDC)  It is important for women to get the whooping cough vaccine in the third trimester of each pregnancy. Vaccines are the best way to prevent this disease. There are 2 different whooping cough vaccines. Both vaccines combine protection against whooping cough, tetanus and diphtheria, but they are for different age groups: Tdap: for everyone 11 years or older, including pregnant  women  DTaP: for children 2 months through 77 years of age  You need the whooping cough vaccine during each of your pregnancies The recommended time to get the shot is during your 27th through 36th week of pregnancy, preferably during the earlier part of this time period. The Centers for Disease Control and Prevention (CDC) recommends that pregnant women receive the whooping cough vaccine for adolescents and adults (called Tdap vaccine) during the third trimester of each pregnancy. The recommended time to get the shot is during your 27th through 36th week of pregnancy, preferably during the earlier part of this time period. This replaces the original recommendation that pregnant women get the vaccine only if they had not previously received it. The [employer] of Obstetricians and Gynecologists and the [employer] support this recommendation.  You should get the whooping cough vaccine while pregnant to pass protection to your baby frame support disabled and/or not supported in this browser  Learn why Vernona Rieger decided to get the whooping cough vaccine in her 3rd trimester of pregnancy and how her baby girl was born with some protection against the disease. Also available on YouTube. After receiving the whooping cough vaccine, your body will create protective antibodies (proteins produced by the body to fight off diseases) and pass some of them to your baby before birth. These antibodies provide your baby some short-term protection against whooping cough in early life. These antibodies can also protect your baby from some of the more serious complications that come along with whooping cough. Your protective antibodies are at their highest about 2 weeks after getting the vaccine, but it takes time to pass them to your baby. So the preferred time to get the whooping cough vaccine is early in your third trimester. The amount of whooping cough antibodies in your body decreases over  time. That is why CDC recommends you get a whooping cough vaccine during each pregnancy. Doing so allows each of your babies to get the greatest number of protective antibodies from you. This means each of your babies will get the best protection possible against this disease.  Getting the whooping cough vaccine while pregnant is better than getting the vaccine after you give birth Whooping cough vaccination during pregnancy is ideal so your baby will have short-term protection as soon as he is born. This early protection is important because your baby will not start getting his whooping cough vaccines until he is 2 months old. These first few months of life are when your baby is at greatest risk for catching whooping cough. This is also when he's at greatest risk for having severe, potentially life-threating complications from the infection. To avoid that gap in protection, it is best to get a whooping cough vaccine during pregnancy. You will then pass protection to your baby before he is born. To continue protecting your baby, he should get whooping cough vaccines starting at 2 months old. You may never have gotten the Tdap vaccine before and did not get it during this pregnancy. If so, you should make sure to get the vaccine immediately after you give birth, before leaving  the hospital or birthing center. It will take about 2 weeks before your body develops protection (antibodies) in response to the vaccine. Once you have protection from the vaccine, you are less likely to give whooping cough to your newborn while caring for him. But remember, your baby will still be at risk for catching whooping cough from others. A recent study looked to see how effective Tdap was at preventing whooping cough in babies whose mothers got the vaccine while pregnant or in the hospital after giving birth. The study found that getting Tdap between 27 through 36 weeks of pregnancy is 85% more effective at preventing whooping  cough in babies younger than 2 months old. Blood tests cannot tell if you need a whooping cough vaccine There are no blood tests that can tell you if you have enough antibodies in your body to protect yourself or your baby against whooping cough. Even if you have been sick with whooping cough in the past or previously received the vaccine, you still should get the vaccine during each pregnancy. Breastfeeding may pass some protective antibodies onto your baby By breastfeeding, you may pass some antibodies you have made in response to the vaccine to your baby. When you get a whooping cough vaccine during your pregnancy, you will have antibodies in your breast milk that you can share with your baby as soon as your milk comes in. However, your baby will not get protective antibodies immediately if you wait to get the whooping cough vaccine until after delivering your baby. This is because it takes about 2 weeks for your body to create antibodies. Learn more about the health benefits of breastfeeding.   Contraception Choices Contraception, also called birth control, refers to methods or devices that prevent pregnancy. Hormonal methods Contraceptive implant A contraceptive implant is a thin, plastic tube that contains a hormone. It is inserted into the upper part of the arm. It can remain in place for up to 3 years. Progestin-only injections Progestin-only injections are injections of progestin, a synthetic form of the hormone progesterone. They are given every 3 months by a health care provider. Birth control pills Birth control pills are pills that contain hormones that prevent pregnancy. They must be taken once a day, preferably at the same time each day. Birth control patch The birth control patch contains hormones that prevent pregnancy. It is placed on the skin and must be changed once a week for three weeks and removed on the fourth week. A prescription is needed to use this method of  contraception. Vaginal ring A vaginal ring contains hormones that prevent pregnancy. It is placed in the vagina for three weeks and removed on the fourth week. After that, the process is repeated with a new ring. A prescription is needed to use this method of contraception. Emergency contraceptive Emergency contraceptives prevent pregnancy after unprotected sex. They come in pill form and can be taken up to 5 days after sex. They work best the sooner they are taken after having sex. Most emergency contraceptives are available without a prescription. This method should not be used as your only form of birth control. Barrier methods Female condom A female condom is a thin sheath that is worn over the penis during sex. Condoms keep sperm from going inside a woman's body. They can be used with a spermicide to increase their effectiveness. They should be disposed after a single use. Female condom A female condom is a soft, loose-fitting sheath that is put into the  vagina before sex. The condom keeps sperm from going inside a woman's body. They should be disposed after a single use. Diaphragm A diaphragm is a soft, dome-shaped barrier. It is inserted into the vagina before sex, along with a spermicide. The diaphragm blocks sperm from entering the uterus, and the spermicide kills sperm. A diaphragm should be left in the vagina for 6-8 hours after sex and removed within 24 hours. A diaphragm is prescribed and fitted by a health care provider. A diaphragm should be replaced every 1-2 years, after giving birth, after gaining more than 15 lb (6.8 kg), and after pelvic surgery. Cervical cap A cervical cap is a round, soft latex or plastic cup that fits over the cervix. It is inserted into the vagina before sex, along with spermicide. It blocks sperm from entering the uterus. The cap should be left in place for 6-8 hours after sex and removed within 48 hours. A cervical cap must be prescribed and fitted by a health  care provider. It should be replaced every 2 years. Sponge A sponge is a soft, circular piece of polyurethane foam with spermicide on it. The sponge helps block sperm from entering the uterus, and the spermicide kills sperm. To use it, you make it wet and then insert it into the vagina. It should be inserted before sex, left in for at least 6 hours after sex, and removed and thrown away within 30 hours. Spermicides Spermicides are chemicals that kill or block sperm from entering the cervix and uterus. They can come as a cream, jelly, suppository, foam, or tablet. A spermicide should be inserted into the vagina with an applicator at least 10-15 minutes before sex to allow time for it to work. The process must be repeated every time you have sex. Spermicides do not require a prescription. Intrauterine contraception Intrauterine device (IUD) An IUD is a T-shaped device that is put in a woman's uterus. There are two types:  Hormone IUD.This type contains progestin, a synthetic form of the hormone progesterone. This type can stay in place for 3-5 years.  Copper IUD.This type is wrapped in copper wire. It can stay in place for 10 years.  Permanent methods of contraception Female tubal ligation In this method, a woman's fallopian tubes are sealed, tied, or blocked during surgery to prevent eggs from traveling to the uterus. Hysteroscopic sterilization In this method, a small, flexible insert is placed into each fallopian tube. The inserts cause scar tissue to form in the fallopian tubes and block them, so sperm cannot reach an egg. The procedure takes about 3 months to be effective. Another form of birth control must be used during those 3 months. Female sterilization This is a procedure to tie off the tubes that carry sperm (vasectomy). After the procedure, the man can still ejaculate fluid (semen). Natural planning methods Natural family planning In this method, a couple does not have sex on days when  the woman could become pregnant. Calendar method This means keeping track of the length of each menstrual cycle, identifying the days when pregnancy can happen, and not having sex on those days. Ovulation method In this method, a couple avoids sex during ovulation. Symptothermal method This method involves not having sex during ovulation. The woman typically checks for ovulation by watching changes in her temperature and in the consistency of cervical mucus. Post-ovulation method In this method, a couple waits to have sex until after ovulation. Summary  Contraception, also called birth control, means methods or devices  that prevent pregnancy.  Hormonal methods of contraception include implants, injections, pills, patches, vaginal rings, and emergency contraceptives.  Barrier methods of contraception can include female condoms, female condoms, diaphragms, cervical caps, sponges, and spermicides.  There are two types of IUDs (intrauterine devices). An IUD can be put in a woman's uterus to prevent pregnancy for 3-5 years.  Permanent sterilization can be done through a procedure for males, females, or both.  Natural family planning methods involve not having sex on days when the woman could become pregnant. This information is not intended to replace advice given to you by your health care provider. Make sure you discuss any questions you have with your health care provider. Document Released: 09/10/2005 Document Revised: 10/13/2016 Document Reviewed: 10/13/2016 Elsevier Interactive Patient Education  2018 [employer].

## 2018-05-02 NOTE — Progress Notes (Signed)
   PRENATAL VISIT NOTE  Subjective:  Judy Collins is a 27 y.o. G1P0 at 3563w1d being seen today for ongoing prenatal care.  She is currently monitored for the following issues for this low-risk pregnancy and has Obesity in pregnancy; Supervision of normal first pregnancy, antepartum; and Anemia in pregnancy, third trimester on their problem list.  Patient reports no complaints.  Contractions: Not present. Vag. Bleeding: None.  Movement: Present. Denies leaking of fluid.   The following portions of the patient's history were reviewed and updated as appropriate: allergies, current medications, past family history, past medical history, past social history, past surgical history and problem list. Problem list updated.  Objective:   Vitals:   05/02/18 0854  BP: 132/84  Pulse: 100  Weight: 279 lb (126.6 kg)    Fetal Status: Fetal Heart Rate (bpm): 153   Movement: Present     General:  Alert, oriented and cooperative. Patient is in no acute distress.  Skin: Skin is warm and dry. No rash noted.   Cardiovascular: Normal heart rate noted  Respiratory: Normal respiratory effort, no problems with respiration noted  Abdomen: Soft, gravid, appropriate for gestational age.  Pain/Pressure: Absent     Pelvic: Cervical exam deferred        Extremities: Normal range of motion.  Edema: Trace  Mental Status: Normal mood and affect. Normal behavior. Normal judgment and thought content.   Assessment and Plan:  Pregnancy: G1P0 at 3663w1d  1. Supervision of normal first pregnancy, antepartum  - POC Urinalysis Dipstick OB - [facility]eds [facility] - [facility]  2. Anemia during pregnancy in third trimester  - CBC  3. Anemia in pregnancy, third trimester   Preterm labor symptoms and general obstetric precautions including but not limited to vaginal bleeding, contractions, leaking of fluid and fetal movement were reviewed in detail with the patient. Please refer to After Visit Summary for other  counseling recommendations.  No follow-ups on file.  Future Appointments  Date Time Provider Department Center  05/05/2018  9:30 AM [facility] US 1 [facility]-MFCUS MFC-US  05/19/2018  9:00 AM Donette LarryBhambri, Melanie, CNM [facility]    Dorathy KinsmanVirginia Monta Police, CNM

## 2018-05-05 ENCOUNTER — Other Ambulatory Visit: Payer: Self-pay | Admitting: Obstetrics and Gynecology

## 2018-05-05 ENCOUNTER — Ambulatory Visit (HOSPITAL_COMMUNITY)
Admission: RE | Admit: 2018-05-05 | Discharge: 2018-05-05 | Disposition: A | Discharge status: Home / Self Care | Payer: Medicaid Other | Source: Ambulatory Visit | Attending: Obstetrics and Gynecology | Admitting: Obstetrics and Gynecology

## 2018-05-05 DIAGNOSIS — Z3A33 33 weeks gestation of pregnancy: Secondary | ICD-10-CM

## 2018-05-05 DIAGNOSIS — Z362 Encounter for other antenatal screening follow-up: Secondary | ICD-10-CM

## 2018-05-05 DIAGNOSIS — O99213 Obesity complicating pregnancy, third trimester: Secondary | ICD-10-CM

## 2018-05-05 DIAGNOSIS — O0933 Supervision of pregnancy with insufficient antenatal care, third trimester: Secondary | ICD-10-CM

## 2018-05-05 DIAGNOSIS — Z3400 Encounter for supervision of normal first pregnancy, unspecified trimester: Secondary | ICD-10-CM

## 2018-05-19 ENCOUNTER — Encounter: Payer: Medicaid Other | Admitting: Certified Nurse Midwife

## 2018-05-21 ENCOUNTER — Ambulatory Visit (INDEPENDENT_AMBULATORY_CARE_PROVIDER_SITE_OTHER): Payer: Medicaid Other | Admitting: Obstetrics & Gynecology

## 2018-05-21 VITALS — BP 124/82 | Wt 281.0 lb

## 2018-05-21 DIAGNOSIS — Z3400 Encounter for supervision of normal first pregnancy, unspecified trimester: Secondary | ICD-10-CM

## 2018-05-21 DIAGNOSIS — O26843 Uterine size-date discrepancy, third trimester: Secondary | ICD-10-CM | POA: Diagnosis not present

## 2018-05-21 DIAGNOSIS — O99013 Anemia complicating pregnancy, third trimester: Secondary | ICD-10-CM

## 2018-05-21 DIAGNOSIS — O99210 Obesity complicating pregnancy, unspecified trimester: Secondary | ICD-10-CM

## 2018-05-21 DIAGNOSIS — O99213 Obesity complicating pregnancy, third trimester: Secondary | ICD-10-CM | POA: Diagnosis not present

## 2018-05-21 LAB — POCT URINALYSIS DIPSTICK OB: Glucose, UA: NEGATIVE

## 2018-05-21 MED ORDER — FERROUS SULFATE 325 (65 FE) MG PO TABS: 325.0000 mg | ORAL_TABLET | Freq: Every day | ORAL | 3 refills | Status: AC

## 2018-05-21 NOTE — Progress Notes (Signed)
Declines flu shot    PRENATAL VISIT NOTE  Subjective:  Judy Collins is a 27 y.o. G1P0 at 6748w6d being seen today for ongoing prenatal care.  She is currently monitored for the following issues for this low-risk pregnancy and has Obesity in pregnancy; Supervision of normal first pregnancy, antepartum; and Anemia in pregnancy, third trimester on their problem list.  Patient reports no complaints.  Contractions: Not present. Vag. Bleeding: None.  Movement: Present. Denies leaking of fluid.   The following portions of the patient's history were reviewed and updated as appropriate: allergies, current medications, past family history, past medical history, past social history, past surgical history and problem list. Problem list updated.  Objective:   Vitals:   05/21/18 0906  BP: 124/82  Weight: 281 lb (127.5 kg)    Fetal Status: Fetal Heart Rate (bpm): 143   Movement: Present     General:  Alert, oriented and cooperative. Patient is in no acute distress.  Skin: Skin is warm and dry. No rash noted.   Cardiovascular: Normal heart rate noted  Respiratory: Normal respiratory effort, no problems with respiration noted  Abdomen: Soft, gravid, appropriate for gestational age.  Pain/Pressure: Absent     Pelvic: Cervical exam deferred        Extremities: Normal range of motion.  Edema: Trace  Mental Status: Normal mood and affect. Normal behavior. Normal judgment and thought content.   Assessment and Plan:  Pregnancy: G1P0 at 4648w6d  1. Supervision of normal first pregnancy, antepartum - GBS next visit - POC Urinalysis Dipstick OB  2. Size of fetus inconsistent with dates in third trimester/ Obesity in pregnancy - US MFM OB FOLLOW UP; Future  3. Anemia in pregnancy, third trimester - continue iron daily.  Preterm labor symptoms and general obstetric precautions including but not limited to vaginal bleeding, contractions, leaking of fluid and fetal movement were reviewed in detail with  the patient. Please refer to After Visit Summary for other counseling recommendations.  No follow-ups on file.  Future Appointments  Date Time Provider Department Center  05/30/2018 10:50 AM Katrinka BlazingSmith, [place] [facility]    Elsie LincolnKelly Leggett, MD

## 2018-05-29 ENCOUNTER — Inpatient Hospital Stay (HOSPITAL_COMMUNITY)
Admission: AD | Admit: 2018-05-29 | Discharge: 2018-06-01 | DRG: 806 | Disposition: A | Discharge status: Home / Self Care | Payer: Medicaid Other | Attending: Obstetrics and Gynecology | Admitting: Obstetrics and Gynecology

## 2018-05-29 ENCOUNTER — Other Ambulatory Visit: Payer: Self-pay

## 2018-05-29 ENCOUNTER — Other Ambulatory Visit: Payer: Self-pay | Admitting: Obstetrics & Gynecology

## 2018-05-29 ENCOUNTER — Ambulatory Visit (HOSPITAL_BASED_OUTPATIENT_CLINIC_OR_DEPARTMENT_OTHER)
Admission: RE | Admit: 2018-05-29 | Discharge: 2018-05-29 | Disposition: A | Discharge status: Home / Self Care | Payer: Medicaid Other | Source: Ambulatory Visit | Attending: Obstetrics & Gynecology | Admitting: Obstetrics & Gynecology

## 2018-05-29 ENCOUNTER — Encounter (HOSPITAL_COMMUNITY): Payer: Self-pay | Admitting: *Deleted

## 2018-05-29 DIAGNOSIS — O134 Gestational [pregnancy-induced] hypertension without significant proteinuria, complicating childbirth: Secondary | ICD-10-CM | POA: Diagnosis present

## 2018-05-29 DIAGNOSIS — O9902 Anemia complicating childbirth: Secondary | ICD-10-CM | POA: Diagnosis present

## 2018-05-29 DIAGNOSIS — O4103X0 Oligohydramnios, third trimester, not applicable or unspecified: Secondary | ICD-10-CM | POA: Diagnosis present

## 2018-05-29 DIAGNOSIS — O26843 Uterine size-date discrepancy, third trimester: Secondary | ICD-10-CM

## 2018-05-29 DIAGNOSIS — D649 Anemia, unspecified: Secondary | ICD-10-CM | POA: Diagnosis present

## 2018-05-29 DIAGNOSIS — Z3A37 37 weeks gestation of pregnancy: Secondary | ICD-10-CM

## 2018-05-29 DIAGNOSIS — Z87891 Personal history of nicotine dependence: Secondary | ICD-10-CM | POA: Diagnosis not present

## 2018-05-29 DIAGNOSIS — Z362 Encounter for other antenatal screening follow-up: Secondary | ICD-10-CM | POA: Diagnosis not present

## 2018-05-29 DIAGNOSIS — O99213 Obesity complicating pregnancy, third trimester: Secondary | ICD-10-CM

## 2018-05-29 DIAGNOSIS — O99333 Smoking (tobacco) complicating pregnancy, third trimester: Secondary | ICD-10-CM

## 2018-05-29 DIAGNOSIS — O99214 Obesity complicating childbirth: Secondary | ICD-10-CM | POA: Diagnosis present

## 2018-05-29 DIAGNOSIS — Z3490 Encounter for supervision of normal pregnancy, unspecified, unspecified trimester: Secondary | ICD-10-CM | POA: Diagnosis present

## 2018-05-29 LAB — PROTEIN / CREATININE RATIO, URINE
CREATININE, URINE: 97 mg/dL
PROTEIN CREATININE RATIO: 0.27 mg/mg{creat} — AB (ref 0.00–0.15)
Total Protein, Urine: 26 mg/dL

## 2018-05-29 LAB — COMPREHENSIVE METABOLIC PANEL
ALK PHOS: 121 U/L (ref 38–126)
ALT: 11 U/L (ref 0–44)
AST: 21 U/L (ref 15–41)
Albumin: 3.1 g/dL — ABNORMAL LOW (ref 3.5–5.0)
Anion gap: 11 (ref 5–15)
BILIRUBIN TOTAL: 0.4 mg/dL (ref 0.3–1.2)
BUN: <5 mg/dL — ABNORMAL LOW (ref 6–20)
CALCIUM: 9 mg/dL (ref 8.9–10.3)
CHLORIDE: 105 mmol/L (ref 98–111)
CO2: 18 mmol/L — ABNORMAL LOW (ref 22–32)
CREATININE: 0.39 mg/dL — AB (ref 0.44–1.00)
Glucose, Bld: 95 mg/dL (ref 70–99)
Potassium: 3.5 mmol/L (ref 3.5–5.1)
Sodium: 134 mmol/L — ABNORMAL LOW (ref 135–145)
TOTAL PROTEIN: 6.5 g/dL (ref 6.5–8.1)

## 2018-05-29 LAB — CBC
HEMATOCRIT: 36 % (ref 36.0–46.0)
HEMOGLOBIN: 11.6 g/dL — AB (ref 12.0–15.0)
MCH: 25.8 pg — AB (ref 26.0–34.0)
MCHC: 32.2 g/dL (ref 30.0–36.0)
MCV: 80.2 fL (ref 78.0–100.0)
Platelets: 152 10*3/uL (ref 150–400)
RBC: 4.49 MIL/uL (ref 3.87–5.11)
RDW: 21.4 % — ABNORMAL HIGH (ref 11.5–15.5)
WBC: 4.7 10*3/uL (ref 4.0–10.5)

## 2018-05-29 LAB — URINALYSIS, ROUTINE W REFLEX MICROSCOPIC
Bilirubin Urine: NEGATIVE
GLUCOSE, UA: NEGATIVE mg/dL
KETONES UR: NEGATIVE mg/dL
Leukocytes, UA: NEGATIVE
Nitrite: NEGATIVE
PROTEIN: NEGATIVE mg/dL
Specific Gravity, Urine: 1.009 (ref 1.005–1.030)
pH: 7 (ref 5.0–8.0)

## 2018-05-29 LAB — TYPE AND SCREEN
ABO/RH(D): O POS
Antibody Screen: NEGATIVE

## 2018-05-29 LAB — GROUP B STREP BY PCR: GROUP B STREP BY PCR: NEGATIVE

## 2018-05-29 MED ORDER — FLEET ENEMA 7-19 GM/118ML RE ENEM: 1.0000 | ENEMA | RECTAL | Status: DC | PRN

## 2018-05-29 MED ORDER — OXYCODONE-ACETAMINOPHEN 5-325 MG PO TABS: 1.0000 | ORAL_TABLET | ORAL | Status: DC | PRN

## 2018-05-29 MED ORDER — FENTANYL CITRATE (PF) 100 MCG/2ML IJ SOLN
50.0000 ug | INTRAMUSCULAR | Status: DC | PRN
  Administered 2018-05-30 (×2): 100 ug via INTRAVENOUS
  Filled 2018-05-29 (×2): qty 2

## 2018-05-29 MED ORDER — TERBUTALINE SULFATE 1 MG/ML IJ SOLN
0.2500 mg | Freq: Once | INTRAMUSCULAR | Status: DC | PRN
  Filled 2018-05-29: qty 1

## 2018-05-29 MED ORDER — ONDANSETRON HCL 4 MG/2ML IJ SOLN: 4.0000 mg | Freq: Four times a day (QID) | INTRAMUSCULAR | Status: DC | PRN

## 2018-05-29 MED ORDER — LABETALOL HCL 5 MG/ML IV SOLN: 40.0000 mg | INTRAVENOUS | Status: DC | PRN

## 2018-05-29 MED ORDER — OXYCODONE-ACETAMINOPHEN 5-325 MG PO TABS: 2.0000 | ORAL_TABLET | ORAL | Status: DC | PRN

## 2018-05-29 MED ORDER — HYDRALAZINE HCL 20 MG/ML IJ SOLN: 5.0000 mg | INTRAMUSCULAR | Status: DC | PRN

## 2018-05-29 MED ORDER — LIDOCAINE HCL (PF) 1 % IJ SOLN
30.0000 mL | INTRAMUSCULAR | Status: AC | PRN
  Administered 2018-05-30: 30 mL via SUBCUTANEOUS
  Filled 2018-05-29: qty 30

## 2018-05-29 MED ORDER — OXYTOCIN BOLUS FROM INFUSION
500.0000 mL | Freq: Once | INTRAVENOUS | Status: AC
  Administered 2018-05-30: 500 mL via INTRAVENOUS

## 2018-05-29 MED ORDER — LABETALOL HCL 5 MG/ML IV SOLN: 20.0000 mg | INTRAVENOUS | Status: DC | PRN

## 2018-05-29 MED ORDER — SOD CITRATE-CITRIC ACID 500-334 MG/5ML PO SOLN: 30.0000 mL | ORAL | Status: DC | PRN

## 2018-05-29 MED ORDER — FENTANYL CITRATE (PF) 100 MCG/2ML IJ SOLN: 50.0000 ug | INTRAMUSCULAR | Status: DC | PRN

## 2018-05-29 MED ORDER — HYDRALAZINE HCL 20 MG/ML IJ SOLN: 10.0000 mg | INTRAMUSCULAR | Status: DC | PRN

## 2018-05-29 MED ORDER — OXYTOCIN 40 UNITS IN LACTATED RINGERS INFUSION - SIMPLE MED
2.5000 [IU]/h | INTRAVENOUS | Status: DC
  Filled 2018-05-29: qty 1000

## 2018-05-29 MED ORDER — ZOLPIDEM TARTRATE 5 MG PO TABS: 5.0000 mg | ORAL_TABLET | Freq: Once | ORAL | Status: DC

## 2018-05-29 MED ORDER — ACETAMINOPHEN 325 MG PO TABS: 650.0000 mg | ORAL_TABLET | ORAL | Status: DC | PRN

## 2018-05-29 MED ORDER — LACTATED RINGERS IV SOLN
INTRAVENOUS | Status: DC
  Administered 2018-05-29: 13:00:00 via INTRAVENOUS

## 2018-05-29 MED ORDER — MISOPROSTOL 25 MCG QUARTER TABLET
25.0000 ug | ORAL_TABLET | ORAL | Status: DC | PRN
  Administered 2018-05-29 (×3): 25 ug via VAGINAL
  Filled 2018-05-29 (×4): qty 1

## 2018-05-29 MED ORDER — LACTATED RINGERS IV SOLN
500.0000 mL | INTRAVENOUS | Status: DC | PRN
  Administered 2018-05-30 (×2): 1000 mL via INTRAVENOUS

## 2018-05-29 NOTE — MAU Note (Signed)
Pt presents to MAU from clinic for monitoring. Pt was told amniotic fluid was low. Denies any pain or VB

## 2018-05-29 NOTE — Anesthesia Pain Management Evaluation Note (Signed)
  CRNA Pain Management Visit Note  Patient: Judy Collins, 27 y.o., female  "Hello I am a member of the anesthesia team at [facility]. We have an anesthesia team available at all times to provide care throughout the hospital, including epidural management and anesthesia for C-section. I don't know your plan for the delivery whether it a natural birth, water birth, IV sedation, nitrous supplementation, doula or epidural, but we want to meet your pain goals."   1.Was your pain managed to your expectations on prior hospitalizations?   Yes   2.What is your expectation for pain management during this hospitalization?     Labor support without medications, Epidural and IV pain meds  3.How can we help you reach that goal? Be available if needed  Record the patient's initial score and the patient's pain goal.   Pain: 0  Pain Goal: 4 [facility] wants you to be able to say your pain was always managed very well.  Judy Collins 05/29/2018

## 2018-05-29 NOTE — Progress Notes (Addendum)
OB/GYN Faculty Practice: Labor Progress Note Judy Collins is a 27 y.o. female G1P0 with IUP at [redacted]w[redacted]d by LMP presenting for IOL for oligohydramnios and gHTN .   Subjective: Patient is resting comfortably. No complaints at this time.   Objective: BP 138/74   Pulse 72   Temp 98.2 F (36.8 C) (Oral)   Resp 17   Ht 5\' 7"  (1.702 m)   Wt 129.7 kg   BMI 44.79 kg/m  Gen: Alert, NAD Dilation: 1 Effacement (%): 50 Cervical Position: Middle Station: -3, -2 Presentation: Vertex Exam by:: Foye Clock RN   FHT: baseline 150s, moderate variability, + accelerations, no decelerations   Assessment and Plan: 27 y.o. G1P0 [redacted]w[redacted]d here for IOL for oligohydramnios and IOL.   Labor: progressing  -- pain control: None  -- Cytotec x2 - last dose 1820  -- Plan to return around 2220 to place foley bulb.   Fetal Well-Being: Cephalic by sutures .  -- Category 1 - continuous fetal monitoring  -- GBS (negative)   Charna Elizabeth MS4 [facility]  9:24 PM   Jacklyn Shell

## 2018-05-29 NOTE — H&P (Addendum)
OBSTETRIC ADMISSION HISTORY AND PHYSICAL  Judy Collins is a 27 y.o. female G1P0 with IUP at [redacted]w[redacted]d by LMP presenting for IOL for gHTN (pre-eclampsia labs collected, not yet resulted). She reports +FMs, No LOF, no VB, no blurry vision, headaches or peripheral edema, and RUQ pain.  She plans on breast and bottle feeding. She request Nexplanon for birth control. She received her prenatal care at [facility]   Dating: By LMP --->  Estimated Date of Delivery: 06/19/18  Sono:  @[redacted]w[redacted]d , CWD, normal anatomy, cephalic presentation, vertex lie, 588g, 71% EFW   Prenatal History/Complications:  Past Medical History: Past Medical History:  Diagnosis Date  . Medical history non-contributory     Past Surgical History: Past Surgical History:  Procedure Laterality Date  . NO PAST SURGERIES      Obstetrical History: OB History    Gravida  1   Para      Term      Preterm      AB      Living        SAB      TAB      Ectopic      Multiple      Live Births             Social History: Social History   Socioeconomic History  . Marital status: Single    Spouse name: Not on file  . Number of children: Not on file  . Years of education: Not on file  . Highest education level: Not on file  Occupational History  . Not on file  Social Needs  . Financial resource strain: Not on file  . Food insecurity:    Worry: Not on file    Inability: Not on file  . Transportation needs:    Medical: Not on file    Non-medical: Not on file  Tobacco Use  . Smoking status: Former Smoker    Types: Cigars  . Smokeless tobacco: Never Used  Substance and Sexual Activity  . Alcohol use: No    Frequency: Never    Comment: occ  . Drug use: No  . Sexual activity: Yes  Lifestyle  . Physical activity:    Days per week: Not on file    Minutes per session: Not on file  . Stress: Not on file  Relationships  . Social connections:    Talks on phone: Not on file    Gets together: Not on file    Attends  religious service: Not on file    Active member of club or organization: Not on file    Attends meetings of clubs or organizations: Not on file    Relationship status: Not on file  Other Topics Concern  . Not on file  Social History Narrative  . Not on file    Family History: History reviewed. No pertinent family history.  Allergies: No Known Allergies  Medications Prior to Admission  Medication Sig Dispense Refill Last Dose  . ferrous sulfate 325 (65 FE) MG tablet Take 1 tablet (325 mg total) by mouth daily. 30 tablet 3   . Prenatal Vit-Fe Fumarate-FA (MULTIVITAMIN-PRENATAL) 27-0.8 MG TABS tablet Take 1 tablet by mouth daily at 12 noon.   Taking   Review of Systems   All systems reviewed and negative except as stated in HPI  Blood pressure 132/90, pulse 88, temperature 98.2 F (36.8 C), resp. rate 16, height 5\' 7"  (1.702 m), weight 129.7 kg. General appearance: alert, cooperative, appears stated  age and no distress  Cardiac: regular rate and rhythm  Lungs: no increased work of breathing, CTAB  Abdomen: soft, non-tender; bowel sounds normal Pelvic: deferred  Extremities: no sign of DVT Presentation: unsure Fetal monitoring Baseline: 150 bpm, Variability: Good {> 6 bpm), Accelerations: Reactive and Decelerations: +variables (+/-late decel?) Uterine activity +irritability    Prenatal labs: ABO, Rh: O/RH(D) POSITIVE/-- (05/13 1149) Antibody: NO ANTIBODIES DETECTED (05/13 1149) Rubella: 3.67 (05/13 1149) RPR: NON-REACTIVE (07/03 1012)  HBsAg: NON-REACTIVE (05/13 1149)  HIV: NON-REACTIVE (07/03 1012)  GBS:   Unknown at this moment  1 hr Glucola 103 Genetic screening  NIPS low risk  Anatomy US Normal  Prenatal Transfer Tool  Maternal Diabetes: No Genetic Screening: Normal NIPS low risk  Maternal Ultrasounds/Referrals: Normal Fetal Ultrasounds or other Referrals:  Referred to Materal Fetal Medicine  Maternal Substance Abuse:  No Significant Maternal Medications:  Meds  include: Other:  Prenatal, Iron, tylenol PRN  Significant Maternal Lab Results: Lab values include: Other: Unknown GBS at this moment  No results found for this or any previous visit (from the past 24 hour(s)).  Patient Active Problem List   Diagnosis Date Noted  . Anemia in pregnancy, third trimester 03/28/2018  . Obesity in pregnancy 02/03/2018  . Supervision of normal first pregnancy, antepartum 02/03/2018    Assessment/Plan:  Judy Collins is a 26 y.o. G1P0 at [redacted]w[redacted]d here for IOL for gHTN (pre-eclampsia labs collected, not yet resulted).   #Labor: Patient presented for NST in the setting of newly diagnosed oligo. Not feeling contractions, reports no VB or water breaking. Given severe range pressures, oligo, and +/- decel on fetal monitor, will plan to keep patient here for pre-E workup and probable IOL as she is now at 37 weeks.  #Pain: Desires no pains meds right this second, may change her mind later.   #FWB: Cat I tracing  #ID:  GBS unknown  #MOF: Breast & bottle  #MOC: Nexplanon #Circ:  N/A (girl)  Raynelle Highland, Medical Student  05/29/2018, 12:43 PM  STUDENT ADDENDUM I have separately seen and examined the patient. I have discussed the findings and exam with the medical student and agree with the above note. I helped develop the management plan that is described in the student's note, and I agree with the content.   Clayton Bibles, CNM 05/29/18  1:33 PM

## 2018-05-30 ENCOUNTER — Inpatient Hospital Stay (HOSPITAL_COMMUNITY): Payer: Medicaid Other | Admitting: Anesthesiology

## 2018-05-30 ENCOUNTER — Encounter: Payer: Medicaid Other | Admitting: Advanced Practice Midwife

## 2018-05-30 ENCOUNTER — Encounter (HOSPITAL_COMMUNITY): Payer: Self-pay | Admitting: *Deleted

## 2018-05-30 DIAGNOSIS — O134 Gestational [pregnancy-induced] hypertension without significant proteinuria, complicating childbirth: Secondary | ICD-10-CM

## 2018-05-30 DIAGNOSIS — Z3A37 37 weeks gestation of pregnancy: Secondary | ICD-10-CM

## 2018-05-30 DIAGNOSIS — O4103X0 Oligohydramnios, third trimester, not applicable or unspecified: Secondary | ICD-10-CM

## 2018-05-30 LAB — CBC
HCT: 33.4 % — ABNORMAL LOW (ref 36.0–46.0)
HEMATOCRIT: 34.9 % — AB (ref 36.0–46.0)
HEMOGLOBIN: 11.4 g/dL — AB (ref 12.0–15.0)
Hemoglobin: 10.9 g/dL — ABNORMAL LOW (ref 12.0–15.0)
MCH: 26.2 pg (ref 26.0–34.0)
MCH: 26.1 pg (ref 26.0–34.0)
MCHC: 32.7 g/dL (ref 30.0–36.0)
MCHC: 32.6 g/dL (ref 30.0–36.0)
MCV: 80.2 fL (ref 78.0–100.0)
MCV: 79.9 fL (ref 78.0–100.0)
PLATELETS: 139 10*3/uL — AB (ref 150–400)
Platelets: 131 10*3/uL — ABNORMAL LOW (ref 150–400)
RBC: 4.35 MIL/uL (ref 3.87–5.11)
RBC: 4.18 MIL/uL (ref 3.87–5.11)
RDW: 21.8 % — ABNORMAL HIGH (ref 11.5–15.5)
RDW: 21.1 % — AB (ref 11.5–15.5)
WBC: 6.3 10*3/uL (ref 4.0–10.5)
WBC: 14.4 10*3/uL — AB (ref 4.0–10.5)

## 2018-05-30 LAB — COMPREHENSIVE METABOLIC PANEL
ALT: 10 U/L (ref 0–44)
AST: 19 U/L (ref 15–41)
Albumin: 2.9 g/dL — ABNORMAL LOW (ref 3.5–5.0)
Alkaline Phosphatase: 123 U/L (ref 38–126)
Anion gap: 12 (ref 5–15)
BUN: <5 mg/dL — ABNORMAL LOW (ref 6–20)
CHLORIDE: 103 mmol/L (ref 98–111)
CO2: 18 mmol/L — AB (ref 22–32)
CREATININE: 0.41 mg/dL — AB (ref 0.44–1.00)
Calcium: 8.8 mg/dL — ABNORMAL LOW (ref 8.9–10.3)
GFR calc Af Amer: >60 mL/min (ref >60)
Glucose, Bld: 94 mg/dL (ref 70–99)
Potassium: 3.8 mmol/L (ref 3.5–5.1)
Sodium: 133 mmol/L — ABNORMAL LOW (ref 135–145)
Total Bilirubin: 0.3 mg/dL (ref 0.3–1.2)
Total Protein: 5.9 g/dL — ABNORMAL LOW (ref 6.5–8.1)

## 2018-05-30 LAB — RPR: RPR: NONREACTIVE

## 2018-05-30 MED ORDER — DIPHENHYDRAMINE HCL 50 MG/ML IJ SOLN: 12.5000 mg | INTRAMUSCULAR | Status: DC | PRN

## 2018-05-30 MED ORDER — DIBUCAINE 1 % RE OINT: 1.0000 "application " | TOPICAL_OINTMENT | RECTAL | Status: DC | PRN

## 2018-05-30 MED ORDER — COCONUT OIL OIL: 1.0000 "application " | TOPICAL_OIL | Status: DC | PRN

## 2018-05-30 MED ORDER — PHENYLEPHRINE 40 MCG/ML (10ML) SYRINGE FOR IV PUSH (FOR BLOOD PRESSURE SUPPORT)
80.0000 ug | PREFILLED_SYRINGE | INTRAVENOUS | Status: DC | PRN
  Filled 2018-05-30: qty 5

## 2018-05-30 MED ORDER — FENTANYL 2.5 MCG/ML BUPIVACAINE 1/10 % EPIDURAL INFUSION (WH - ANES): 14.0000 mL/h | INTRAMUSCULAR | Status: DC | PRN

## 2018-05-30 MED ORDER — OXYTOCIN 40 UNITS IN LACTATED RINGERS INFUSION - SIMPLE MED
1.0000 m[IU]/min | INTRAVENOUS | Status: DC
  Administered 2018-05-30: 2 m[IU]/min via INTRAVENOUS

## 2018-05-30 MED ORDER — EPHEDRINE 5 MG/ML INJ
10.0000 mg | INTRAVENOUS | Status: DC | PRN
  Filled 2018-05-30: qty 2

## 2018-05-30 MED ORDER — WITCH HAZEL-GLYCERIN EX PADS: 1.0000 "application " | MEDICATED_PAD | CUTANEOUS | Status: DC | PRN

## 2018-05-30 MED ORDER — ONDANSETRON HCL 4 MG/2ML IJ SOLN: 4.0000 mg | INTRAMUSCULAR | Status: DC | PRN

## 2018-05-30 MED ORDER — TERBUTALINE SULFATE 1 MG/ML IJ SOLN
0.2500 mg | Freq: Once | INTRAMUSCULAR | Status: AC | PRN
  Administered 2018-05-30: 0.25 mg via SUBCUTANEOUS

## 2018-05-30 MED ORDER — TETANUS-DIPHTH-ACELL PERTUSSIS 5-2.5-18.5 LF-MCG/0.5 IM SUSP: 0.5000 mL | Freq: Once | INTRAMUSCULAR | Status: DC

## 2018-05-30 MED ORDER — SENNOSIDES-DOCUSATE SODIUM 8.6-50 MG PO TABS
2.0000 | ORAL_TABLET | ORAL | Status: DC
  Administered 2018-05-31 (×2): 2 via ORAL
  Filled 2018-05-30 (×2): qty 2

## 2018-05-30 MED ORDER — EPHEDRINE 5 MG/ML INJ: 10.0000 mg | INTRAVENOUS | Status: DC | PRN

## 2018-05-30 MED ORDER — LIDOCAINE HCL (PF) 1 % IJ SOLN
INTRAMUSCULAR | Status: DC | PRN
  Administered 2018-05-30: 5 mL via EPIDURAL

## 2018-05-30 MED ORDER — LACTATED RINGERS AMNIOINFUSION
INTRAVENOUS | Status: DC
  Administered 2018-05-30: 11:00:00 via INTRAUTERINE
  Filled 2018-05-30 (×2): qty 1000

## 2018-05-30 MED ORDER — ACETAMINOPHEN 325 MG PO TABS: 650.0000 mg | ORAL_TABLET | ORAL | Status: DC | PRN

## 2018-05-30 MED ORDER — LACTATED RINGERS IV SOLN: 500.0000 mL | Freq: Once | INTRAVENOUS | Status: DC

## 2018-05-30 MED ORDER — PHENYLEPHRINE 40 MCG/ML (10ML) SYRINGE FOR IV PUSH (FOR BLOOD PRESSURE SUPPORT): 80.0000 ug | PREFILLED_SYRINGE | INTRAVENOUS | Status: DC | PRN

## 2018-05-30 MED ORDER — SOD CITRATE-CITRIC ACID 500-334 MG/5ML PO SOLN
ORAL | Status: AC
  Filled 2018-05-30: qty 15

## 2018-05-30 MED ORDER — BENZOCAINE-MENTHOL 20-0.5 % EX AERO
1.0000 "application " | INHALATION_SPRAY | CUTANEOUS | Status: DC | PRN
  Administered 2018-05-30: 1 via TOPICAL
  Filled 2018-05-30: qty 56

## 2018-05-30 MED ORDER — ONDANSETRON HCL 4 MG PO TABS: 4.0000 mg | ORAL_TABLET | ORAL | Status: DC | PRN

## 2018-05-30 MED ORDER — PHENYLEPHRINE 40 MCG/ML (10ML) SYRINGE FOR IV PUSH (FOR BLOOD PRESSURE SUPPORT)
80.0000 ug | PREFILLED_SYRINGE | INTRAVENOUS | Status: DC | PRN
  Filled 2018-05-30 (×2): qty 10
  Filled 2018-05-30: qty 5

## 2018-05-30 MED ORDER — FENTANYL 2.5 MCG/ML BUPIVACAINE 1/10 % EPIDURAL INFUSION (WH - ANES)
14.0000 mL/h | INTRAMUSCULAR | Status: DC | PRN
  Administered 2018-05-30 (×2): 14 mL/h via EPIDURAL
  Filled 2018-05-30 (×2): qty 100

## 2018-05-30 MED ORDER — SIMETHICONE 80 MG PO CHEW: 80.0000 mg | CHEWABLE_TABLET | ORAL | Status: DC | PRN

## 2018-05-30 MED ORDER — IBUPROFEN 600 MG PO TABS
600.0000 mg | ORAL_TABLET | Freq: Four times a day (QID) | ORAL | Status: DC
  Administered 2018-05-30 – 2018-06-01 (×7): 600 mg via ORAL
  Filled 2018-05-30 (×7): qty 1

## 2018-05-30 MED ORDER — DIPHENHYDRAMINE HCL 25 MG PO CAPS: 25.0000 mg | ORAL_CAPSULE | Freq: Four times a day (QID) | ORAL | Status: DC | PRN

## 2018-05-30 MED ORDER — ZOLPIDEM TARTRATE 5 MG PO TABS: 5.0000 mg | ORAL_TABLET | Freq: Every evening | ORAL | Status: DC | PRN

## 2018-05-30 MED ORDER — PRENATAL MULTIVITAMIN CH
1.0000 | ORAL_TABLET | Freq: Every day | ORAL | Status: DC
  Administered 2018-05-31: 1 via ORAL
  Filled 2018-05-30: qty 1

## 2018-05-30 NOTE — Progress Notes (Signed)
Vitals:   05/29/18 2243 05/29/18 2302  BP:  121/65  Pulse:  86  Resp:  17  Temp: 98.1 F (36.7 C)    Pt refused foley.  Cytotec placed instead.  Probably SROM per RN at 0010, cx now 2-3/100/-1.  Ctx q 1-3 minutes.  PT tolerating well

## 2018-05-30 NOTE — Progress Notes (Signed)
Labor Progress Note Judy Collins is a 27 y.o. G1P0 at [redacted]w[redacted]d presented for IOL for oligo  S:  Feeling ctx, epidural helping little.  O:  BP (!) 151/97   Pulse 95   Temp 99.3 F (37.4 C) (Oral)   Resp 18   Ht 5\' 7"  (1.702 m)   Wt 129.7 kg   SpO2 99%   BMI 44.79 kg/m  EFM: baseline 145 bpm/ mod variability/ + accels/ variable decels  Toco: q4 min SVE: 8/80/-1  A/P: 27 y.o. G1P0 [redacted]w[redacted]d  1. Labor: active phase 2. FWB: Cat II 3. Pain: epidural 4. gHTN- stable  IUPC placed. Plan to replace epidural. Start Pitocin. Anticipate SVD.  Donette Larry, CNM 10:12 AM

## 2018-05-30 NOTE — Anesthesia Procedure Notes (Signed)
Epidural Patient location during procedure: OB Start time: 05/30/2018 2:18 AM End time: 05/30/2018 2:41 AM  Staffing Anesthesiologist: Trevor Iha, MD Performed: anesthesiologist   Preanesthetic Checklist Completed: patient identified, site marked, surgical consent, pre-op evaluation, timeout performed, IV checked, risks and benefits discussed and monitors and equipment checked  Epidural Patient position: sitting Prep: site prepped and draped and DuraPrep Patient monitoring: continuous pulse ox and blood pressure Approach: midline Location: L3-L4 Injection technique: LOR air  Needle:  Needle type: Tuohy  Needle gauge: 17 G Needle length: 9 cm and 9 Needle insertion depth: 9 cm Catheter type: closed end flexible Catheter size: 19 Gauge Catheter at skin depth: 15 cm Test dose: negative  Assessment Events: blood not aspirated, injection not painful, no injection resistance, negative IV test and no paresthesia  Additional Notes 1 attempt . Pt tolerated procedure well.

## 2018-05-30 NOTE — Anesthesia Preprocedure Evaluation (Addendum)
Anesthesia Evaluation  Patient identified by MRN, date of birth, ID band  Reviewed: Allergy & Precautions, NPO status , Patient's Chart, lab work & pertinent test results  Airway Mallampati: III  TM Distance: >3 FB Neck ROM: Full    Dental no notable dental hx. (+) Teeth Intact   Pulmonary former smoker,    Pulmonary exam normal breath sounds clear to auscultation       Cardiovascular Exercise Tolerance: Good hypertension, Pt. on medications Normal cardiovascular exam Rhythm:Regular Rate:Normal     Neuro/Psych negative neurological ROS  negative psych ROS   GI/Hepatic   Endo/Other  Morbid obesity  Renal/GU      Musculoskeletal   Abdominal (+) + obese,   Peds  Hematology  (+) anemia ,   Anesthesia Other Findings Pt w PIH on hydralazine   Reproductive/Obstetrics (+) Pregnancy                             Lab Results  Component Value Date   WBC 6.3 05/30/2018   HGB 11.4 (L) 05/30/2018   HCT 34.9 (L) 05/30/2018   MCV 80.2 05/30/2018   PLT 131 (L) 05/30/2018    Anesthesia Physical Anesthesia Plan  ASA: III  Anesthesia Plan: Epidural   Post-op Pain Management:    Induction:   PONV Risk Score and Plan:   Airway Management Planned:   Additional Equipment:   Intra-op Plan:   Post-operative Plan:   Informed Consent: I have reviewed the patients History and Physical, chart, labs and discussed the procedure including the risks, benefits and alternatives for the proposed anesthesia with the patient or authorized representative who has indicated his/her understanding and acceptance.     Plan Discussed with:   Anesthesia Plan Comments: (Re check plts prior to D/C epidural)       Anesthesia Quick Evaluation

## 2018-05-30 NOTE — Lactation Note (Signed)
This note was copied from a baby's chart. Lactation Consultation Note  Patient Name: Judy Collins Date: 05/30/2018 Reason for consult: Initial assessment;Primapara;1st time breastfeeding;Early term 45-38.6wks  9 hours old FT female who is being exclusively BF by her mother, she's a P1. Mom took BF classes at the [facility] office in [place]. LC reviewed hand expression with mom and she was able to get big drops of colostrum out of her left breast. She doesn't have a pump at home, [facility] offered a hand pump from the hospital. Pump instructions, cleaning and storage were reviewed as well as milk storage guidelines.  Offered assistance with latch, baby was already cueing. LC took baby to the left breast in football position and she was able to latch briefly a few swallows were heard. She would unlatch after a few minutes and mom had to constantly re-latch her. Discussed tips for a deep latch, then mom requested to have baby switched to the other breast. LC took baby again STS this time in cross cradle position and she was able to stay latched on for longer; but mom let go and ended up nursing in typical cradle, she said it was more comfortable for her. Explained to mom that babies don't have much head/neck support at this point and to make sure she stays latched on deeply. Baby still nursing when exiting the room.  Encouraged mom to feed baby STS 8-12 times/24 hours or sooner if feeding cues are present.  Discussed cluster feeding and newborn sleeping cycle. BF brochure, BF resources and feeding diary were reviewed, mom is aware of LC services and will call PRN.  Maternal Data Formula Feeding for Exclusion: No Has patient been taught Hand Expression?: Yes Does the patient have breastfeeding experience prior to this delivery?: No  Feeding Feeding Type: Breast Fed Length of feed: 12 min(baby still nursing when exiting the room)  LATCH Score Latch: Repeated attempts needed to sustain latch, nipple held  in mouth throughout feeding, stimulation needed to elicit sucking reflex.  Audible Swallowing: A few with stimulation(with breast compressions)  Type of Nipple: Everted at rest and after stimulation  Comfort (Breast/Nipple): Soft / non-tender  Hold (Positioning): Assistance needed to correctly position infant at breast and maintain latch.  LATCH Score: 7  Interventions Interventions: Breast feeding basics reviewed;Assisted with latch;Skin to skin;Breast massage;Hand express;Breast compression;Adjust position;Support pillows;Position options;Hand pump  Lactation Tools Discussed/Used Tools: Pump Breast pump type: Manual WIC Program: Yes Pump Review: Setup, frequency, and cleaning;Milk Storage Initiated by:: MPeck Date initiated:: 05/30/18   Consult Status Consult Status: Follow-up Date: 05/31/18 Follow-up type: In-patient    Jawuan Robb Venetia Constable 05/30/2018, 10:37 PM

## 2018-05-30 NOTE — Anesthesia Postprocedure Evaluation (Signed)
Anesthesia Post Note  Patient: Judy Collins  Procedure(s) Performed: AN AD HOC LABOR EPIDURAL     Patient location during evaluation: Mother Baby Anesthesia Type: Epidural Level of consciousness: awake and alert and oriented Pain management: satisfactory to patient Vital Signs Assessment: post-procedure vital signs reviewed and stable Respiratory status: spontaneous breathing and nonlabored ventilation Cardiovascular status: stable Postop Assessment: no headache, no backache, no signs of nausea or vomiting, adequate PO intake and patient able to bend at knees (patient up walking) Anesthetic complications: no    Last Vitals:  Vitals:   05/30/18 1600 05/30/18 1657  BP: 109/75 128/70  Pulse: 98 93  Resp: 18 18  Temp: 36.8 C 37.1 C  SpO2: 100% 100%    Last Pain:  Vitals:   05/30/18 1657  TempSrc: Axillary  PainSc:    Pain Goal:                 Izic Stfort

## 2018-05-31 NOTE — Progress Notes (Signed)
Post Partum Day 1 Subjective: Doing well. Still having some back pain, attributes it to the epidural. Some cramping but feels like ibuprofen helping. Breastfeeding is going well. Denies any headaches, vision changes, abdominal pain. Some small clots on pad overnight. Spray bottle helps with burning with lacerations. Still feels like needs to "take her time" getting around, no lightheadedness or dizziness.   Objective: Blood pressure 105/60, pulse 97, temperature 98.3 F (36.8 C), temperature source Oral, resp. rate 20, height 5\' 7"  (1.702 m), weight 129.7 kg, SpO2 100 %, unknown if currently breastfeeding.  Physical Exam:  General: sitting up breastfeeding, smiling and in good spirits, NAD Lochia: appropriate Uterine Fundus: firm Incision: not examined DVT Evaluation: 1+ pitting edema to mid shins bilaterally  Recent Labs    05/30/18 0127 05/30/18 1423  HGB 11.4* 10.9*  HCT 34.9* 33.4*    Assessment/Plan: Plan for discharge home tomorrow, discussed additional day to monitor Bps and for breastfeeding support Planning on Nexplanon for birth control, will need counseling regarding effectiveness with her BMI   Breahna Boylen S. Earlene Plater, DO OB/GYN Fellow

## 2018-06-01 MED ORDER — IBUPROFEN 600 MG PO TABS: 600.0000 mg | ORAL_TABLET | Freq: Four times a day (QID) | ORAL | 0 refills | Status: AC

## 2018-06-01 MED ORDER — ACETAMINOPHEN 325 MG PO TABS: 650.0000 mg | ORAL_TABLET | ORAL | Status: AC | PRN

## 2018-06-01 NOTE — Discharge Summary (Signed)
Obstetrics Discharge Summary OB/GYN Faculty Practice   Patient Name: Judy Collins DOB: [birth year] MRN: [id number]  Date of admission: 05/29/2018 Delivering MD: Mirian Mo   Date of discharge: 06/01/2018  Admitting diagnosis: 37WKS, LOW FLUID Intrauterine pregnancy: [redacted]w[redacted]d     Secondary diagnosis:   Active Problems:   Encounter for induction of labor   Additional problems:  . Oligohydramnios, gHTN     Discharge diagnosis: Term Pregnancy Delivered and Gestational Hypertension                                            Postpartum procedures: None  Complications: 2* perineal laceration s/p repair, bil paraurethral lac.  Hospital course: Judy Collins is a 27 y.o. [redacted]w[redacted]d who was admitted for IOL 2/2 oligo and gHTN. Her pregnancy was complicated by anemia, obesity. Her labor course was uncomplicated. Delivery was uncomplicated. Please see delivery/op note for additional details. Her postpartum course was uncomplicated. She was breastfeeding without difficulty. By day of discharge, she was passing flatus, urinating, eating and drinking without difficulty. Her pain was well-controlled, and she was discharged home with acetaminophen and ibuprofen. She will follow-up in clinic in 4 weeks.   Physical exam  Vitals:   05/31/18 0524 05/31/18 1500 05/31/18 2223 06/01/18 0657  BP: 105/60 108/67 131/80 118/62  Pulse: 97 89 89 79  Resp: 20 19  18   Temp: 98.3 F (36.8 C) 98.7 F (37.1 C) 98.6 F (37 C) 98.4 F (36.9 C)  TempSrc: Oral Oral Oral Oral  SpO2:   100%   Weight:      Height:       General: AAOx3, NAD. Lochia: appropriate Uterine Fundus: firm Incision: N/A DVT Evaluation: No evidence of DVT seen on physical exam. Labs: Lab Results  Component Value Date   WBC 14.4 (H) 05/30/2018   HGB 10.9 (L) 05/30/2018   HCT 33.4 (L) 05/30/2018   MCV 79.9 05/30/2018   PLT 139 (L) 05/30/2018   CMP Latest Ref Rng & Units 05/30/2018  Glucose 70 - 99 mg/dL 94  BUN 6 - 20 mg/dL <4(U)  Creatinine  9.81 - 1.00 mg/dL 1.91(Y)  Sodium 782 - 956 mmol/L 133(L)  Potassium 3.5 - 5.1 mmol/L 3.8  Chloride 98 - 111 mmol/L 103  CO2 22 - 32 mmol/L 18(L)  Calcium 8.9 - 10.3 mg/dL 2.1(H)  Total Protein 6.5 - 8.1 g/dL 5.9(L)  Total Bilirubin 0.3 - 1.2 mg/dL 0.3  Alkaline Phos 38 - 126 U/L 123  AST 15 - 41 U/L 19  ALT 0 - 44 U/L 10    Discharge instructions: Per After Visit Summary and "Baby and Me Booklet"  After visit meds:  Allergies as of 06/01/2018   No Known Allergies     Medication List    TAKE these medications   acetaminophen 325 MG tablet Commonly known as:  TYLENOL Take 2 tablets (650 mg total) by mouth every 4 (four) hours as needed (for pain scale < 4).   ferrous sulfate 325 (65 FE) MG tablet Take 1 tablet (325 mg total) by mouth daily.   ibuprofen 600 MG tablet Commonly known as:  ADVIL,MOTRIN Take 1 tablet (600 mg total) by mouth every 6 (six) hours.   multivitamin-prenatal 27-0.8 MG Tabs tablet Take 1 tablet by mouth daily at 12 noon.       Postpartum contraception: Nexplanon Diet: Routine Diet Activity: Advance as tolerated.  Pelvic rest for 6 weeks.   Outpatient follow up:4 wks Follow-up Appt:No future appointments. Follow-up Visit:No follow-ups on file.  Newborn Data: Live born female  Birth Weight: 7 lb 7.9 oz (3400 g) APGAR: 9, 9  Newborn Delivery   Birth date/time:  05/30/2018 12:57:00 Delivery type:  Vaginal, Spontaneous     Baby Feeding: Breast Disposition:home with mother

## 2018-06-01 NOTE — Lactation Note (Signed)
This note was copied from a baby's chart. Lactation Consultation Note  Mom decided not to take the [facility] loaner pump.  Referral sent to [facility], that Mom will need a DEBP.  Judy Collins 06/01/2018, 10:21 AM

## 2018-06-01 NOTE — Discharge Summary (Signed)
erro

## 2018-06-01 NOTE — Discharge Instructions (Addendum)
Vaginal Delivery, Care After Refer to this sheet in the next few weeks. These discharge instructions provide you with information on caring for yourself after delivery. Your caregiver may also give you specific instructions. Your treatment has been planned according to the most current medical practices available, but problems sometimes occur. Call your caregiver if you have any problems or questions after you go home. HOME CARE INSTRUCTIONS 1. Take over-the-counter or prescription medicines only as directed by your caregiver or pharmacist. 2. Do not drink alcohol, especially if you are breastfeeding or taking medicine to relieve pain. 3. Do not smoke tobacco. 4. Continue to use good perineal care. Good perineal care includes: 1. Wiping your perineum from back to front 2. Keeping your perineum clean. 3. You can do sitz baths twice a day, to help keep this area clean 5. Do not use tampons, douche or have sex until your caregiver says it is okay. 6. Shower only and avoid sitting in submerged water, aside from sitz baths 7. Wear a well-fitting bra that provides breast support. 8. Eat healthy foods. 9. Drink enough fluids to keep your urine clear or pale yellow. 10. Eat high-fiber foods such as whole grain cereals and breads, brown rice, beans, and fresh fruits and vegetables every day. These foods may help prevent or relieve constipation. 11. Avoid constipation with high fiber foods or medications, such as miralax or metamucil 12. Follow your caregiver's recommendations regarding resumption of activities such as climbing stairs, driving, lifting, exercising, or traveling. 13. Talk to your caregiver about resuming sexual activities. Resumption of sexual activities is dependent upon your risk of infection, your rate of healing, and your comfort and desire to resume sexual activity. 14. Try to have someone help you with your household activities and your newborn for at least a few days after you leave  the hospital. 15. Rest as much as possible. Try to rest or take a nap when your newborn is sleeping. 16. Increase your activities gradually. 17. Keep all of your scheduled postpartum appointments. It is very important to keep your scheduled follow-up appointments. At these appointments, your caregiver will be checking to make sure that you are healing physically and emotionally. SEEK MEDICAL CARE IF:   You are passing large clots from your vagina. Save any clots to show your caregiver.  You have a foul smelling discharge from your vagina.  You have trouble urinating.  You are urinating frequently.  You have pain when you urinate.  You have a change in your bowel movements.  You have increasing redness, pain, or swelling near your vaginal incision (episiotomy) or vaginal tear.  You have pus draining from your episiotomy or vaginal tear.  Your episiotomy or vaginal tear is separating.  You have painful, hard, or reddened breasts.  You have a severe headache.  You have blurred vision or see spots.  You feel sad or depressed.  You have thoughts of hurting yourself or your newborn.  You have questions about your care, the care of your newborn, or medicines.  You are dizzy or light-headed.  You have a rash.  You have nausea or vomiting.  You were breastfeeding and have not had a menstrual period within 12 weeks after you stopped breastfeeding.  You are not breastfeeding and have not had a menstrual period by the 12th week after delivery.  You have a fever. SEEK IMMEDIATE MEDICAL CARE IF:   You have persistent pain.  You have chest pain.  You have shortness of breath.    You faint.  You have leg pain.  You have stomach pain.  Your vaginal bleeding saturates two or more sanitary pads in 1 hour. MAKE SURE YOU:   Understand these instructions.  Will watch your condition.  Will get help right away if you are not doing well or get worse. Document Released:  09/07/2000 Document Revised: 01/25/2014 Document Reviewed: 05/07/2012 ExitCare Patient Information 2015 [employer]. This information is not intended to replace advice given to you by your health care provider. Make sure you discuss any questions you have with your health care provider.  Sitz Bath A sitz bath is a warm water bath taken in the sitting position. The water covers only the hips and butt (buttocks). We recommend using one that fits in the toilet, to help with ease of use and cleanliness. It may be used for either healing or cleaning purposes. Sitz baths are also used to relieve pain, itching, or muscle tightening (spasms). The water may contain medicine. Moist heat will help you heal and relax.  HOME CARE  Take 3 to 4 sitz baths a day. 18. Fill the bathtub half-full with warm water. 19. Sit in the water and open the drain a little. 20. Turn on the warm water to keep the tub half-full. Keep the water running constantly. 21. Soak in the water for 15 to 20 minutes. 22. After the sitz bath, pat the affected area dry. GET HELP RIGHT AWAY IF: You get worse instead of better. Stop the sitz baths if you get worse. MAKE SURE YOU:  Understand these instructions.  Will watch your condition.  Will get help right away if you are not doing well or get worse. Document Released: 10/18/2004 Document Revised: 06/04/2012 Document Reviewed: 01/08/2011 ExitCare Patient Information 2015 [employer]. This information is not intended to replace advice given to you by your health care provider. Make sure you discuss any questions you have with your health care provider.    

## 2018-06-01 NOTE — Lactation Note (Addendum)
This note was copied from a baby's chart. Lactation Consultation Note  Patient Name: Judy Collins Date: 06/01/2018 Reason for consult: Follow-up assessment;Primapara;1st time breastfeeding;Early term 37-38.6wks;Infant weight loss  Visited with P1 Mom of ET infant at 8% weight loss at 43 hrs old.  Mom holding dressed and swaddled baby sucking on a pacifier and fussing.  Mom states baby has been feeding "on and off" for last 3 hrs.  Offered to assist with latching baby.  Discussed pacifier use in early days can interfere with feeding cues.  Recommended waiting for 4-6 weeks.  When baby opened wide while crying, noted a suspected short lingual frenulum causing tongue to not lift in center, edges of tongue elevating making tongue look cupped.  Unwrapped and undressed baby.  Baby acting very hungry. Mom using cradle hold without pillow support.  Readjusted Mom's hands to support and sandwich her breast and support baby's head.  Pillows added over 'boppy" pillow for better elevation.  Baby opens widely, but pops off a few times.  Nipple not pinched when baby comes off, rounded.  Baby able to attain a deeper latch and stay on for a few minutes, no swallows audible on right breast.  Repositioned baby for left breast.  Hand expression revealed transitional milk which sprayed from nipple.  Baby latched well onto right breast.  Mom needing much guidance with correct hand placement on breast, and use of secure head support to latch baby deeply.  Baby relaxed and seems contented.  Occasional swallows heard.  Talked about pumping after baby breastfeeds.  Mom has WIC.  [facility] loaner program discussed.  Concern about $30 cash needed.  Will encouraged Mom to hand express and use manual pump and offer her EBM back to baby due to weight loss of 8%, and baby being ET.  Request made for Mom to see OP lactation this week. WIC loaner given.  Mom to pump both breasts for 15 mins and offer baby her EBM by  spoon.    Feeding Feeding Type: Breast Fed Length of feed: 60 min(off and on per mom)  LATCH Score Latch: Repeated attempts needed to sustain latch, nipple held in mouth throughout feeding, stimulation needed to elicit sucking reflex.  Audible Swallowing: A few with stimulation  Type of Nipple: Everted at rest and after stimulation  Comfort (Breast/Nipple): Soft / non-tender  Hold (Positioning): Assistance needed to correctly position infant at breast and maintain latch.  LATCH Score: 7  Interventions Interventions: Breast feeding basics reviewed;Assisted with latch;Skin to skin;Breast massage;Hand express;Breast compression;Adjust position;Support pillows;Position options;Hand pump  Lactation Tools Discussed/Used Tools: Pump Breast pump type: Manual WIC Program: Yes   Consult Status Consult Status: Complete Date: 06/01/18 Follow-up type: Call as needed    Judy Collins 06/01/2018, 8:41 AM

## 2018-06-02 ENCOUNTER — Encounter (HOSPITAL_COMMUNITY): Payer: Self-pay

## 2018-06-02 ENCOUNTER — Inpatient Hospital Stay (HOSPITAL_COMMUNITY)
Admission: AD | Admit: 2018-06-02 | Discharge: 2018-06-03 | Disposition: A | Discharge status: Home / Self Care | Payer: Medicaid Other | Source: Ambulatory Visit | Attending: Obstetrics & Gynecology | Admitting: Obstetrics & Gynecology

## 2018-06-02 DIAGNOSIS — O99013 Anemia complicating pregnancy, third trimester: Secondary | ICD-10-CM

## 2018-06-02 DIAGNOSIS — M7989 Other specified soft tissue disorders: Secondary | ICD-10-CM | POA: Insufficient documentation

## 2018-06-02 DIAGNOSIS — O1205 Gestational edema, complicating the puerperium: Secondary | ICD-10-CM | POA: Diagnosis present

## 2018-06-02 DIAGNOSIS — O9089 Other complications of the puerperium, not elsewhere classified: Secondary | ICD-10-CM | POA: Diagnosis not present

## 2018-06-02 DIAGNOSIS — F17290 Nicotine dependence, other tobacco product, uncomplicated: Secondary | ICD-10-CM | POA: Diagnosis not present

## 2018-06-02 DIAGNOSIS — N644 Mastodynia: Secondary | ICD-10-CM | POA: Insufficient documentation

## 2018-06-02 DIAGNOSIS — O1495 Unspecified pre-eclampsia, complicating the puerperium: Secondary | ICD-10-CM | POA: Diagnosis not present

## 2018-06-02 LAB — CBC
HCT: 27.6 % — ABNORMAL LOW (ref 36.0–46.0)
HEMOGLOBIN: 8.8 g/dL — AB (ref 12.0–15.0)
MCH: 26 pg (ref 26.0–34.0)
MCHC: 31.9 g/dL (ref 30.0–36.0)
MCV: 81.4 fL (ref 78.0–100.0)
Platelets: 224 10*3/uL (ref 150–400)
RBC: 3.39 MIL/uL — ABNORMAL LOW (ref 3.87–5.11)
RDW: 21.4 % — ABNORMAL HIGH (ref 11.5–15.5)
WBC: 8 10*3/uL (ref 4.0–10.5)

## 2018-06-02 LAB — COMPREHENSIVE METABOLIC PANEL
ALK PHOS: 88 U/L (ref 38–126)
ALT: 28 U/L (ref 0–44)
ANION GAP: 11 (ref 5–15)
AST: 43 U/L — ABNORMAL HIGH (ref 15–41)
Albumin: 2.8 g/dL — ABNORMAL LOW (ref 3.5–5.0)
BILIRUBIN TOTAL: 0.8 mg/dL (ref 0.3–1.2)
BUN: 6 mg/dL (ref 6–20)
CALCIUM: 8 mg/dL — AB (ref 8.9–10.3)
CO2: 23 mmol/L (ref 22–32)
Chloride: 104 mmol/L (ref 98–111)
Creatinine, Ser: 0.48 mg/dL (ref 0.44–1.00)
GFR calc Af Amer: >60 mL/min (ref >60)
GFR calc non Af Amer: >60 mL/min (ref >60)
GLUCOSE: 97 mg/dL (ref 70–99)
Potassium: 3.3 mmol/L — ABNORMAL LOW (ref 3.5–5.1)
Sodium: 138 mmol/L (ref 135–145)
TOTAL PROTEIN: 5.6 g/dL — AB (ref 6.5–8.1)

## 2018-06-02 LAB — PROTEIN / CREATININE RATIO, URINE
Creatinine, Urine: 182 mg/dL
PROTEIN CREATININE RATIO: 0.32 mg/mg{creat} — AB (ref 0.00–0.15)
Total Protein, Urine: 59 mg/dL

## 2018-06-02 NOTE — MAU Provider Note (Signed)
History     CSN: [id number]  Arrival date and time: 06/02/18 2135   First Provider Initiated Contact with Patient 06/02/18 2251      Chief Complaint  Patient presents with  . Foot Swelling   HPI  Ms.  Judy Collins is a 27 y.o. year old G73P1001 female 3 days PP who presents to MAU reporting swollen feet and pain in RT nipple. She has a SVD on 05/30/2018. No h/o elevated BPs in pregnancy. Denies any H/A, dizziness, blurry vision, or epigastric pain.  Past Medical History:  Diagnosis Date  . Medical history non-contributory     Past Surgical History:  Procedure Laterality Date  . NO PAST SURGERIES      History reviewed. No pertinent family history.  Social History   Tobacco Use  . Smoking status: Former Smoker    Types: Cigars  . Smokeless tobacco: Never Used  Substance Use Topics  . Alcohol use: No    Frequency: Never    Comment: occ  . Drug use: No    Allergies: No Known Allergies  Medications Prior to Admission  Medication Sig Dispense Refill Last Dose  . acetaminophen (TYLENOL) 325 MG tablet Take 2 tablets (650 mg total) by mouth every 4 (four) hours as needed (for pain scale < 4).     . ferrous sulfate 325 (65 FE) MG tablet Take 1 tablet (325 mg total) by mouth daily. 30 tablet 3 Past Week at Unknown time  . ibuprofen (ADVIL,MOTRIN) 600 MG tablet Take 1 tablet (600 mg total) by mouth every 6 (six) hours. 30 tablet 0   . Prenatal Vit-Fe Fumarate-FA (MULTIVITAMIN-PRENATAL) 27-0.8 MG TABS tablet Take 1 tablet by mouth daily at 12 noon.   Past Week at Unknown time    Review of Systems  Constitutional: Negative.   HENT: Negative.   Eyes: Negative.   Respiratory: Negative.   Cardiovascular: Positive for leg swelling (feet).  Gastrointestinal: Negative.   Endocrine: Negative.   Genitourinary: Negative.   Musculoskeletal: Negative.   Skin: Positive for wound (RT nipple bleeding and painful).       Blood and crust on nipple  Allergic/Immunologic: Negative.     Neurological: Negative.   Hematological: Negative.   Psychiatric/Behavioral: Negative.    Physical Exam  BP: 149/88, 110/94, 113/97, 128/87, 130/92, 129/74, 127/81 Blood pressure (!) 110/94, pulse 100, temperature 99.1 F (37.3 C), temperature source Oral, resp. rate 18, SpO2 100 %, currently breastfeeding.  Physical Exam  Constitutional: She is oriented to person, place, and time. She appears well-developed and well-nourished.  HENT:  Head: Normocephalic and atraumatic.  Eyes: Pupils are equal, round, and reactive to light.  Neck: Normal range of motion.  Cardiovascular: Normal rate, regular rhythm, normal heart sounds and intact distal pulses.  Respiratory: Effort normal and breath sounds normal.    <0.5 cm vertical slit of dried old blood on RT nipple c/w poor latch  GI: Soft. Bowel sounds are normal.  Genitourinary:  Genitourinary Comments: Pelvic not indicated  Musculoskeletal: Normal range of motion. She exhibits edema (1+ pitting edema).  Neurological: She is alert and oriented to person, place, and time. She has normal reflexes.  Skin: Skin is warm and dry.  Psychiatric: She has a normal mood and affect. Her behavior is normal. Judgment and thought content normal.    MAU Course  Procedures  MDM CBC CMP P/C Ratio from cath urine serial   *Consult with Dr. Roselie Awkward @ 0000 - notified of patient's complaints, assessments, lab results,  recommended tx plan d/c home, Rx Norvasc 10 mg, BP recheck in 2-3 days  Results for orders placed or performed during the hospital encounter of 06/02/18 (from the past 48 hour(s))  Protein / creatinine ratio, urine     Status: Abnormal   Collection Time: 06/02/18 10:43 PM  Result Value Ref Range   Creatinine, Urine 182.00 mg/dL   Total Protein, Urine 59 mg/dL    Comment: NO NORMAL RANGE ESTABLISHED FOR THIS TEST   Protein Creatinine Ratio 0.32 (H) 0.00 - 0.15 mg/mg[Cre]    Comment: Performed at [facility], [address]  [place], [place] [address]  CBC     Status: Abnormal   Collection Time: 06/02/18 11:03 PM  Result Value Ref Range   WBC 8.0 4.0 - 10.5 K/uL   RBC 3.39 (L) 3.87 - 5.11 MIL/uL   Hemoglobin 8.8 (L) 12.0 - 15.0 g/dL   HCT 27.6 (L) 36.0 - 46.0 %   MCV 81.4 78.0 - 100.0 fL   MCH 26.0 26.0 - 34.0 pg   MCHC 31.9 30.0 - 36.0 g/dL   RDW 21.4 (H) 11.5 - 15.5 %   Platelets 224 150 - 400 K/uL    Comment: Performed at [facility], [address] [place], [place] [address]  Comprehensive metabolic panel     Status: Abnormal   Collection Time: 06/02/18 11:03 PM  Result Value Ref Range   Sodium 138 135 - 145 mmol/L   Potassium 3.3 (L) 3.5 - 5.1 mmol/L   Chloride 104 98 - 111 mmol/L   CO2 23 22 - 32 mmol/L   Glucose, Bld 97 70 - 99 mg/dL   BUN 6 6 - 20 mg/dL   Creatinine, Ser 0.48 0.44 - 1.00 mg/dL   Calcium 8.0 (L) 8.9 - 10.3 mg/dL   Total Protein 5.6 (L) 6.5 - 8.1 g/dL   Albumin 2.8 (L) 3.5 - 5.0 g/dL   AST 43 (H) 15 - 41 U/L   ALT 28 0 - 44 U/L   Alkaline Phosphatase 88 38 - 126 U/L   Total Bilirubin 0.8 0.3 - 1.2 mg/dL   GFR calc non Af Amer >60 >60 mL/min   GFR calc Af Amer >60 >60 mL/min    Comment: (NOTE) The eGFR has been calculated using the CKD EPI equation. This calculation has not been validated in all clinical situations. eGFR's persistently <60 mL/min signify possible Chronic Kidney Disease.    Anion gap 11 5 - 15    Comment: Performed at [facility], [address] [place], [place] [address]    Assessment and Plan  Postpartum edema - Advised to increase water intake and elevate BLE while sitting or resting  Gestational hypertension with significant proteinuria, postpartum  - Rx for Norvasc 10 mg  - F/U with [facility] for BP recheck on either 9/12 or 9/13 -- msg sent to [facility] provided on postpartum HTN  - Discharge patient - Patient verbalized an understanding of the plan of care and agrees.     Laury Deep, MSN, CNM 06/02/2018, 10:51 PM

## 2018-06-02 NOTE — MAU Note (Signed)
Pt here with swelling in feet and pain in right breast. Delivered vaginally on Friday.

## 2018-06-03 DIAGNOSIS — O1205 Gestational edema, complicating the puerperium: Secondary | ICD-10-CM

## 2018-06-03 DIAGNOSIS — O1495 Unspecified pre-eclampsia, complicating the puerperium: Secondary | ICD-10-CM | POA: Diagnosis present

## 2018-06-03 MED ORDER — AMLODIPINE BESYLATE 10 MG PO TABS: 10.0000 mg | ORAL_TABLET | Freq: Every day | ORAL | 1 refills | Status: AC

## 2018-06-04 ENCOUNTER — Ambulatory Visit: Payer: Medicaid Other | Admitting: *Deleted

## 2018-06-04 VITALS — BP 140/83 | HR 99

## 2018-06-04 DIAGNOSIS — O139 Gestational [pregnancy-induced] hypertension without significant proteinuria, unspecified trimester: Secondary | ICD-10-CM

## 2018-06-04 NOTE — Progress Notes (Signed)
Pt here 5 days post delivery for BP check only.  BP today is 140/83.  Pt denies any headache or visual changes.  She has not picked up her BP med that she was prescribed at discharge.  She states that she is going to get it today.  Side effects discussed with pt.  She is to stay on BP med until her post partum visit when she will be reevaluated.  She is to call prn.

## 2018-06-27 ENCOUNTER — Ambulatory Visit (INDEPENDENT_AMBULATORY_CARE_PROVIDER_SITE_OTHER): Payer: Medicaid Other | Admitting: Advanced Practice Midwife

## 2018-06-27 ENCOUNTER — Encounter: Payer: Self-pay | Admitting: Advanced Practice Midwife

## 2018-06-27 VITALS — BP 109/54 | HR 75 | Wt 254.0 lb

## 2018-06-27 DIAGNOSIS — Z01812 Encounter for preprocedural laboratory examination: Secondary | ICD-10-CM

## 2018-06-27 DIAGNOSIS — Z3202 Encounter for pregnancy test, result negative: Secondary | ICD-10-CM | POA: Diagnosis not present

## 2018-06-27 DIAGNOSIS — Z975 Presence of (intrauterine) contraceptive device: Secondary | ICD-10-CM | POA: Insufficient documentation

## 2018-06-27 DIAGNOSIS — Z1389 Encounter for screening for other disorder: Secondary | ICD-10-CM

## 2018-06-27 DIAGNOSIS — Z3046 Encounter for surveillance of implantable subdermal contraceptive: Secondary | ICD-10-CM | POA: Diagnosis not present

## 2018-06-27 DIAGNOSIS — Z30017 Encounter for initial prescription of implantable subdermal contraceptive: Secondary | ICD-10-CM

## 2018-06-27 LAB — POCT URINE PREGNANCY: Preg Test, Ur: NEGATIVE

## 2018-06-27 MED ORDER — ETONOGESTREL 68 MG ~~LOC~~ IMPL
68.0000 mg | DRUG_IMPLANT | Freq: Once | SUBCUTANEOUS | Status: AC
  Administered 2018-06-30: 68 mg via SUBCUTANEOUS

## 2018-06-27 NOTE — Progress Notes (Signed)
Post Partum Exam  Judy Collins is a 27 y.o. G68P1001 female who presents for a postpartum visit. She is 4 weeks postpartum following a spontaneous vaginal delivery. I have fully reviewed the prenatal and intrapartum course. The delivery was at 37 gestational weeks and 2 days.  Anesthesia: epidural. Postpartum course has been unremarkable. Baby's course has been unremarkable. Baby is feeding by breast. Bleeding no bleeding. Bowel function is normal. Bladder function is normal. Patient is sexually active. Contraception method is Nexplanon. Postpartum depression screening:neg  The following portions of the patient's history were reviewed and updated as appropriate: allergies, current medications, past family history, past medical history, past social history, past surgical history and problem list.  Review of Systems Pertinent items noted in HPI and remainder of comprehensive ROS otherwise negative.    Objective:    BP 116/78 mmHg  Pulse 78  Resp 16  Ht 5\' 5"  (1.651 m)  Wt 211 lb (95.709 kg)  BMI 35.11 kg/m2  Breastfeeding? Yes   VS reviewed, nursing note reviewed,  Constitutional: well developed, well nourished, no distress HEENT: normocephalic CV: normal rate Pulm/chest wall: normal effort Abdomen: soft Neuro: alert and oriented x 3 Skin: warm, dry Psych: affect normal   Nexplanon Insertion Procedure Patient identified, informed consent performed, consent signed.   Patient does understand that irregular bleeding is a very common side effect of this medication. She was advised to have backup contraception for one week after placement. Pregnancy test in clinic today was negative.  Appropriate time out taken.  Patient's left arm was prepped and draped in the usual sterile fashion. Patient was prepped with alcohol swab and then injected with 3 ml of 1% lidocaine.  She was prepped with betadine, Nexplanon removed from packaging,  Device confirmed in needle, then inserted full length of needle  and withdrawn per handbook instructions. Nexplanon was able to palpated in the patient's arm; patient palpated the insert herself. There was minimal blood loss.  Patient insertion site covered with guaze and a pressure bandage to reduce any bruising.  The patient tolerated the procedure well and was given post procedure instructions.    Assessment/Plan:   1. Pre-procedure lab exam --UPT negative, no intercourse since delivery - POCT urine pregnancy  2. Encounter for initial prescription of implantable subdermal contraceptive --Discussed Nexplanon vs IUD and other forms of contraception. Pt desires Nexplanon.  See procedure note above. - etonogestrel (NEXPLANON) implant 68 mg  3. Postpartum examination following vaginal delivery --Pt doing well, good support at home. Breastfeeding is going well. --Vaginal pain for 1-2 weeks after delivery, now resolved. Has not had intercourse since delivery. --Follow up in: 1 year for well woman exam or as needed.   Sharen Counter, CNM 11:42 AM

## 2018-06-30 DIAGNOSIS — Z3046 Encounter for surveillance of implantable subdermal contraceptive: Secondary | ICD-10-CM | POA: Diagnosis not present

## 2019-04-28 IMAGING — US US MFM OB DETAIL+14 WK
1 series · 14 of 28 positions shown · non-contrast
Comparison: none

[Series 1: us mfm ob detail+14 wk · 14 of 116 slices shown]
[im 5/116]
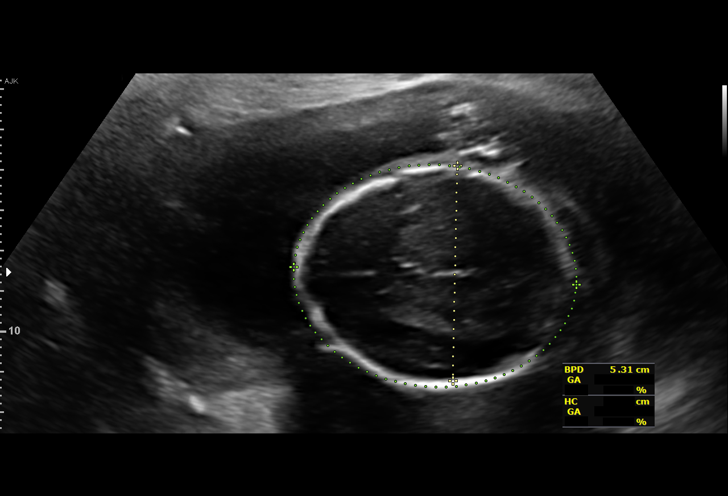
[im 13/116]
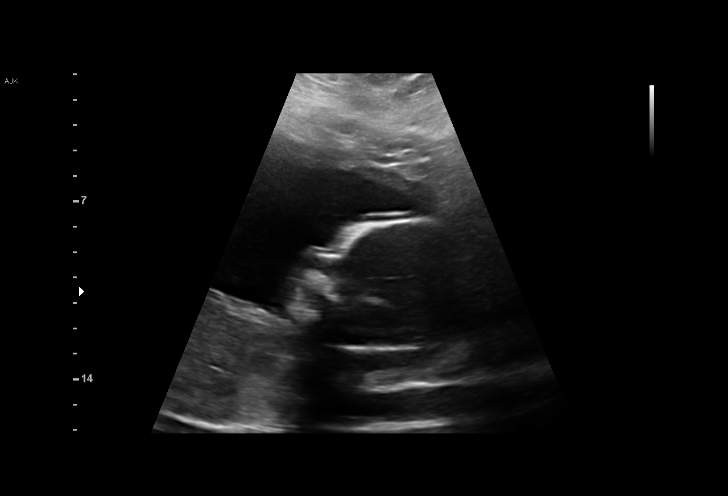
[im 22/116]
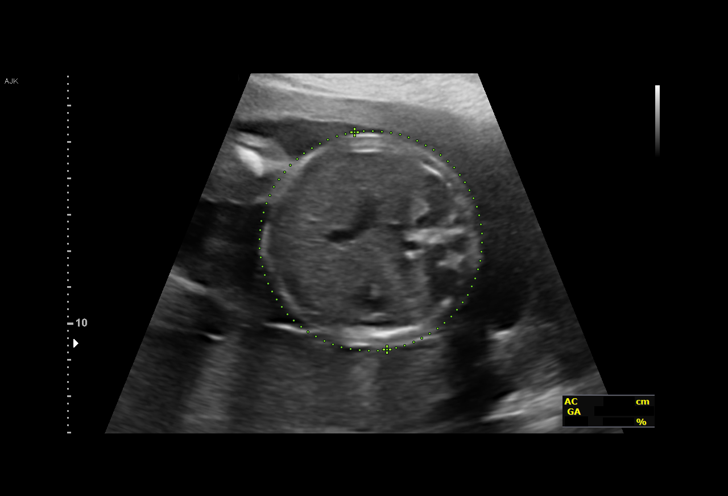
[im 30/116]
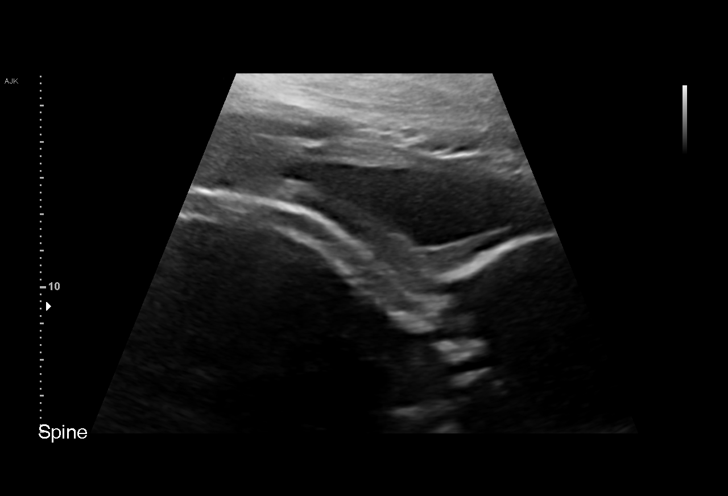
[im 39/116]
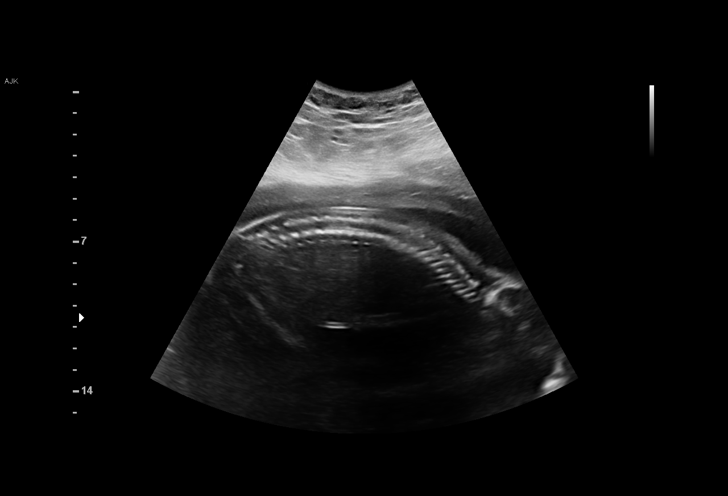
[im 47/116]
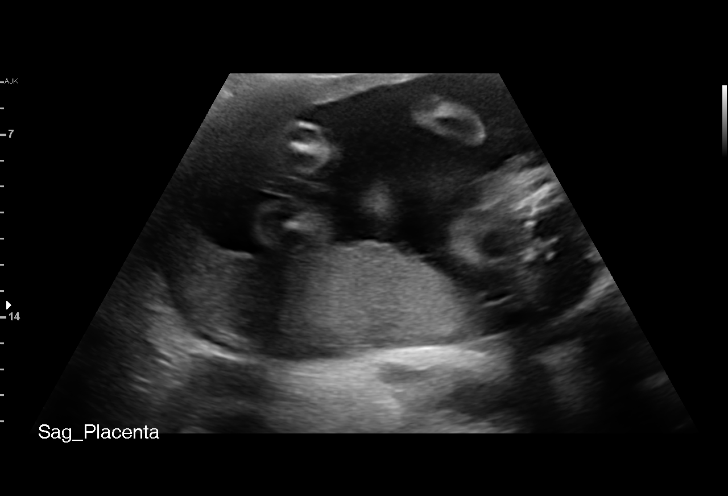
[im 56/116]
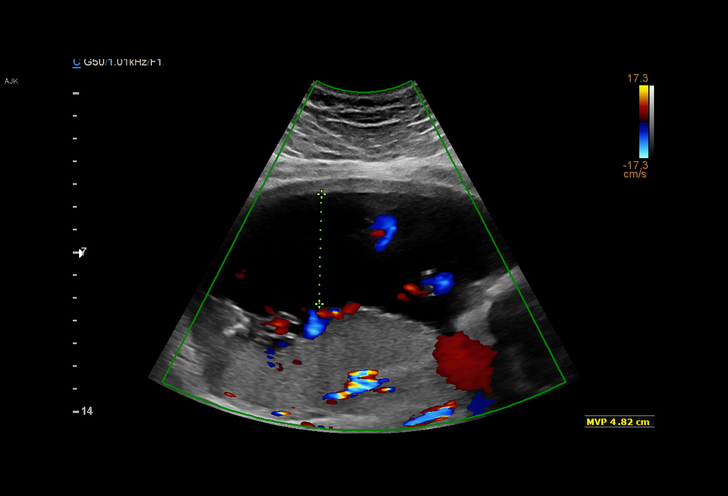
[im 64/116]
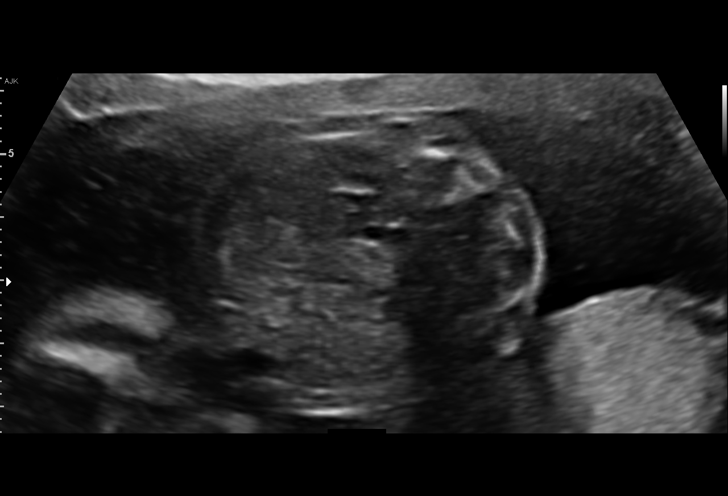
[im 73/116]
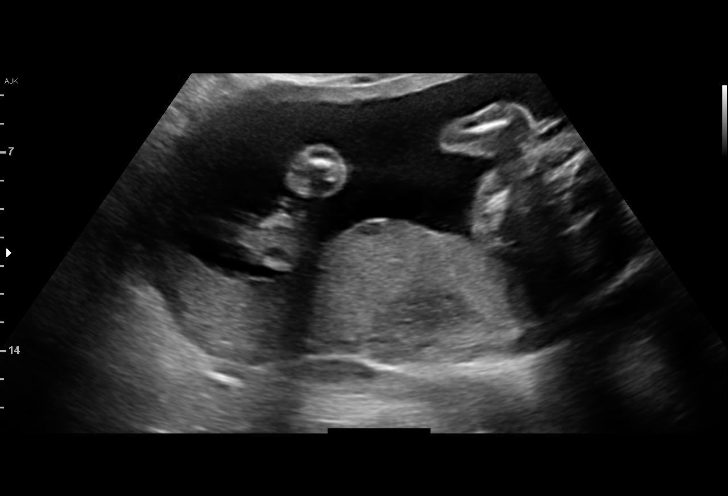
[im 81/116]
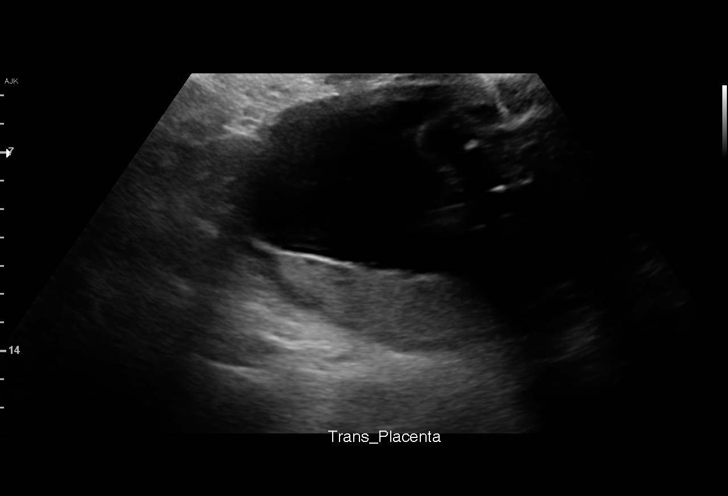
[im 90/116]
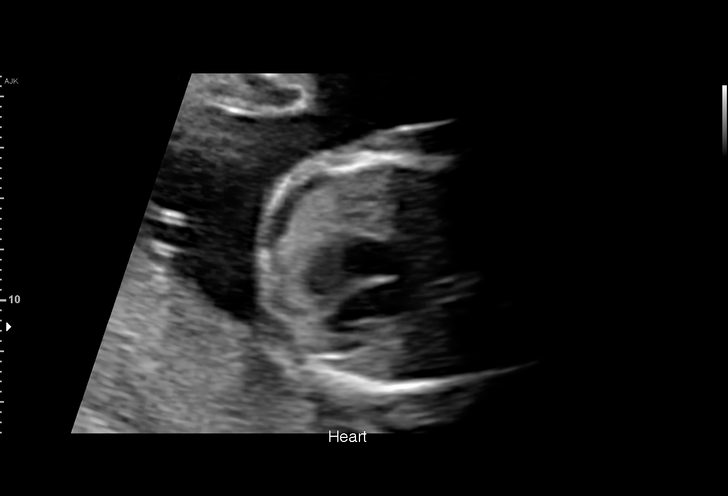
[im 98/116]
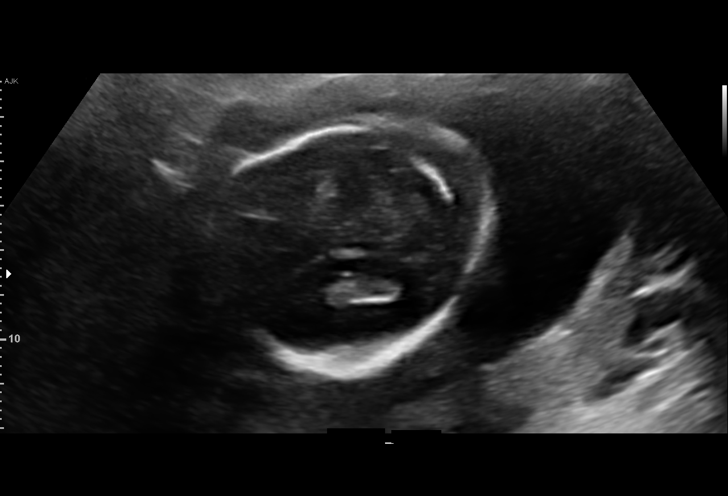
[im 107/116]
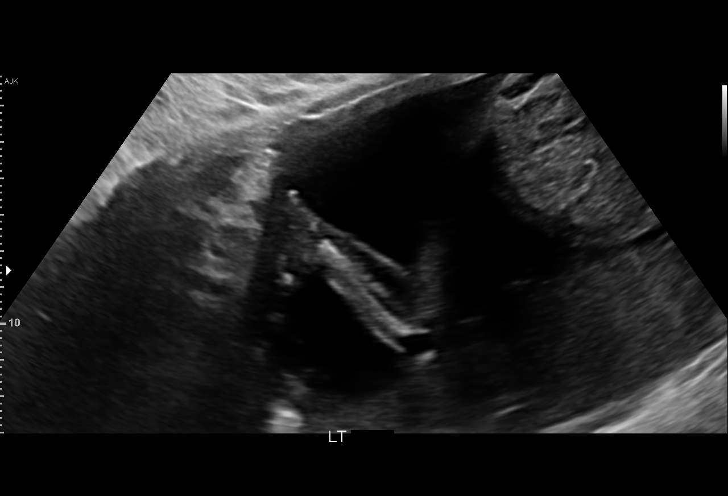
[im 116/116]
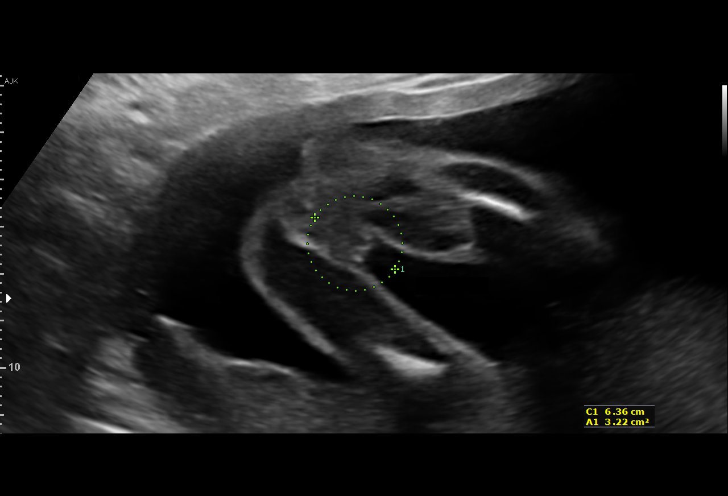

[14 of 28 positions shown; findings below may reference images not displayed]

Indications

21 weeks gestation of pregnancy
Late prenatal care, second trimester
Encounter for uncertain dates
Encounter for antenatal screening for
malformations
Maternal morbid obesity
OB History

Blood Type:            Height:  5'6"   Weight (lb):  275       BMI:
Gravidity:    1
Fetal Evaluation

Num Of Fetuses:     1
Fetal Heart         159
Rate(bpm):
Cardiac Activity:   Observed
Presentation:       Cephalic
Placenta:           Posterior, above cervical os
P. Cord Insertion:  Visualized

Amniotic Fluid
AFI FV:      Subjectively within normal limits

Largest Pocket(cm)
4.82
Biometry

BPD:      53.4  mm     G. Age:  22w 2d         68  %    CI:        73.76   %    70 - 86
FL/HC:      20.8   %    18.4 -
HC:      197.5  mm     G. Age:  22w 0d         49  %    HC/AC:      1.03        1.06 -
AC:       191   mm     G. Age:  23w 6d         94  %    FL/BPD:     76.8   %    71 - 87
FL:         41  mm     G. Age:  23w 2d         87  %    FL/AC:      21.5   %    20 - 24
CER:      24.2  mm     G. Age:  22w 1d         62  %
NFT:       7.5  mm
CM:        6.3  mm

Est. FW:     588  gm      1 lb 5 oz     71  %
Gestational Age

LMP:           21w 5d        Date:  09/12/17                 EDD:   06/19/18
U/S Today:     22w 6d                                        EDD:   06/11/18
Best:          21w 5d     Det. By:  LMP  (09/12/17)          EDD:   06/19/18
Anatomy

Cranium:               Appears normal         Aortic Arch:            Not well visualized
Cavum:                 Appears normal         Ductal Arch:            Not well visualized
Ventricles:            Appears normal         Diaphragm:              Appears normal
Choroid Plexus:        Appears normal         Stomach:                Appears normal, left
sided
Cerebellum:            Appears normal         Abdomen:                Appears normal
Posterior Fossa:       Appears normal         Abdominal Wall:         Appears nml (cord
insert, abd wall)
Nuchal Fold:           Not applicable (>20    Cord Vessels:           Appears normal (3
wks GA)                                        vessel cord)
Face:                  Orbits nl; profile not Kidneys:                Appear normal
well visualized
Lips:                  Appears normal         Bladder:                Appears normal
Thoracic:              Appears normal         Spine:                  Appears normal
Heart:                 Appears normal         Upper Extremities:      Appears normal
(4CH, axis, and
situs)
RVOT:                  Not well visualized    Lower Extremities:      Appears normal
LVOT:                  Appears normal

Other:  Parents do not wish to know sex of fetus. Fetus appears to be a
female. Heels and LT 5th digit visualized. Technically difficult due to
maternal habitus and fetal position.
Cervix Uterus Adnexa

Cervix
Length:           3.37  cm.
Normal appearance by transabdominal scan.

Uterus
No abnormality visualized.

Left Ovary
Not visualized.

Right Ovary
Not visualized.

Adnexa:       No abnormality visualized.
Impression

Single living intrauterine pregnancy at 21w 5d.
Placenta Posterior, above cervical os.
Appropriate fetal growth.
Normal amniotic fluid volume.
The fetal anatomic survey is not complete.
No gross fetal anomalies identified.
The cervix measures 3.37cm transabdominally without
funneling.
Recommendations

Recommend follow-up ultrasound examination in 4 weeks for
reassessment of fetal growth and anatomy.

## 2019-07-20 IMAGING — US US MFM OB FOLLOW-UP
1 series · 12 of 28 positions shown · non-contrast
Comparison: none

[Series 1: us mfm ob follow-up · 12 of 36 slices shown]
[im 2/36]
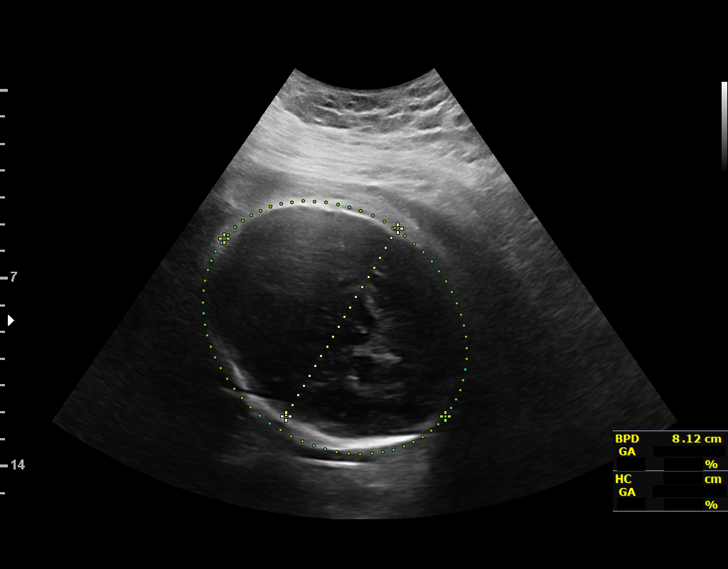
[im 4/36]
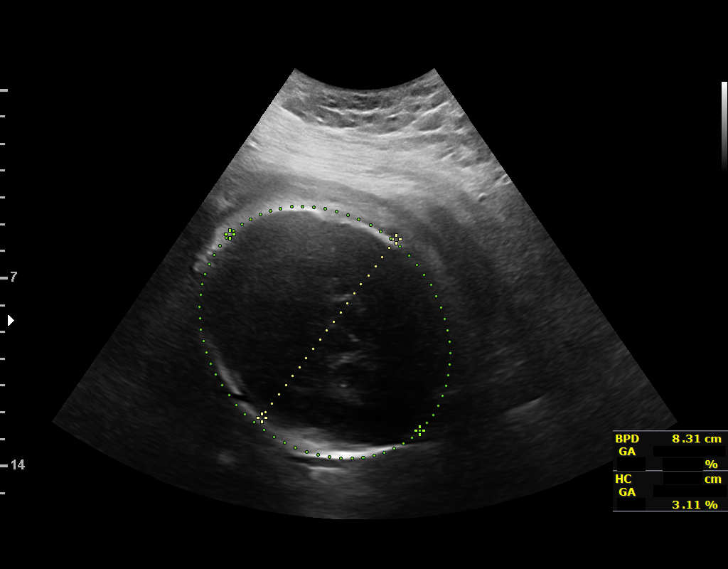
[im 7/36]
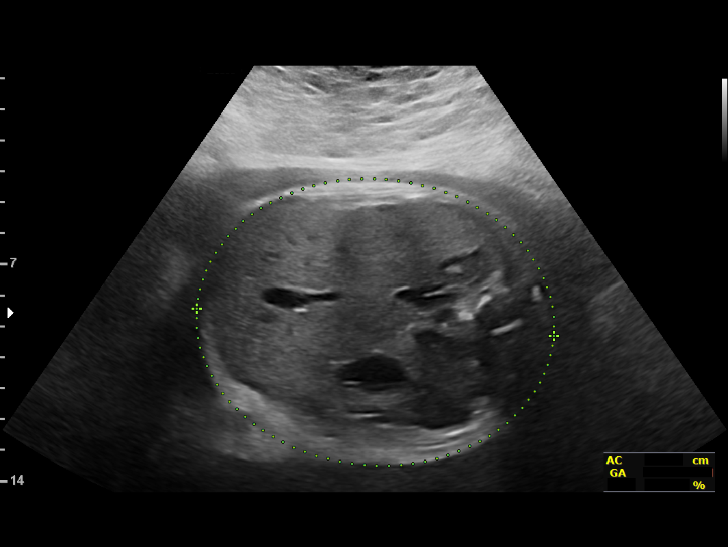
[im 11/36]
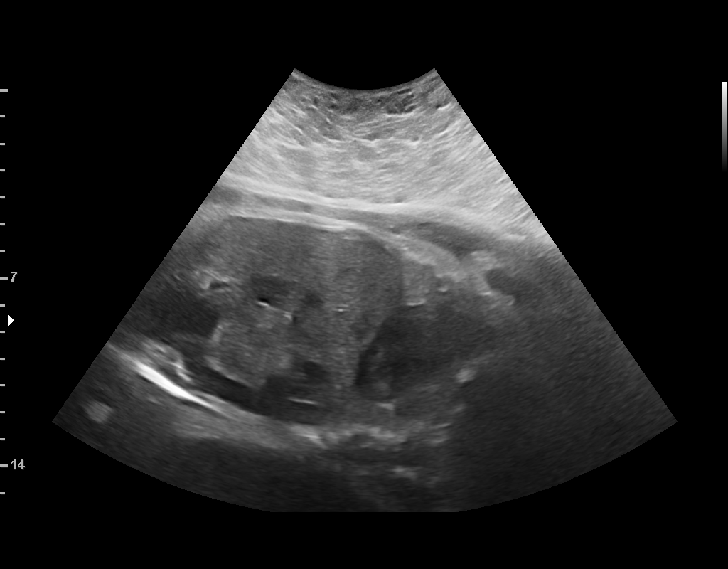
[im 13/36]
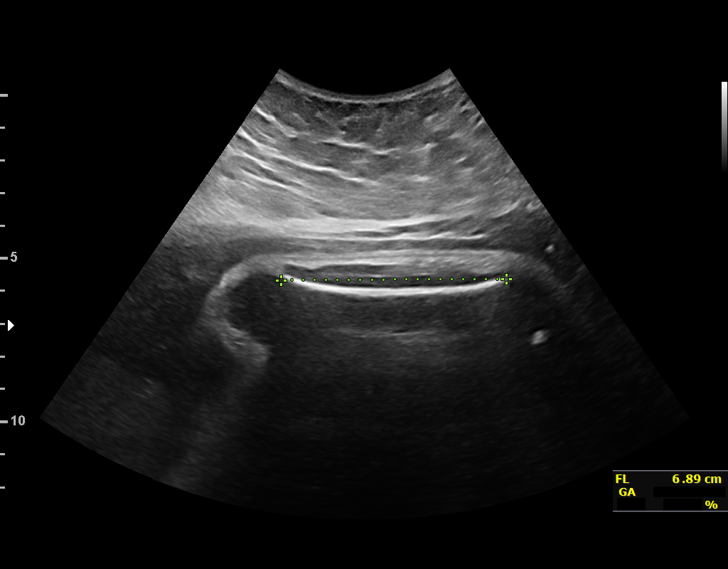
[im 16/36]
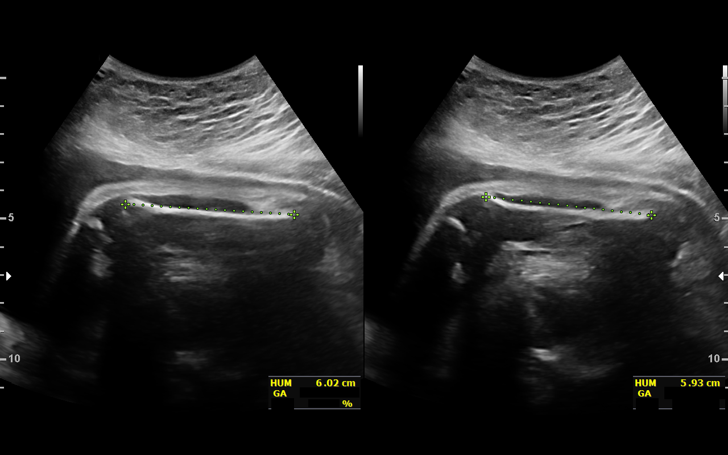
[im 20/36]
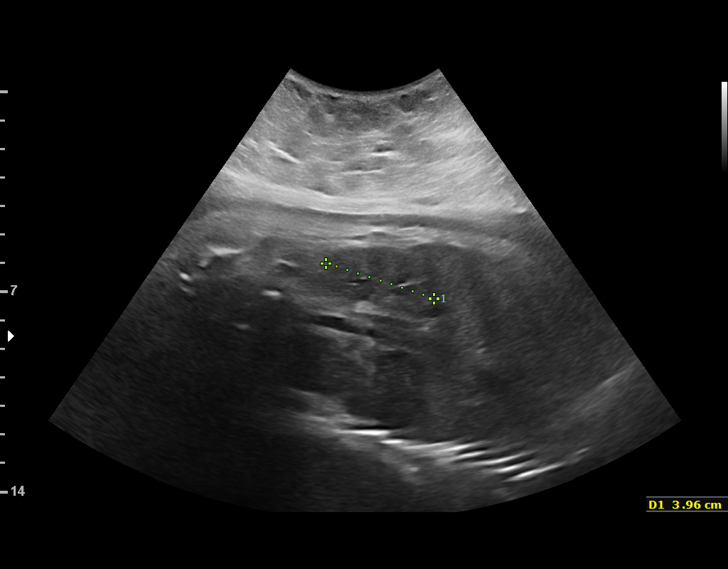
[im 23/36]
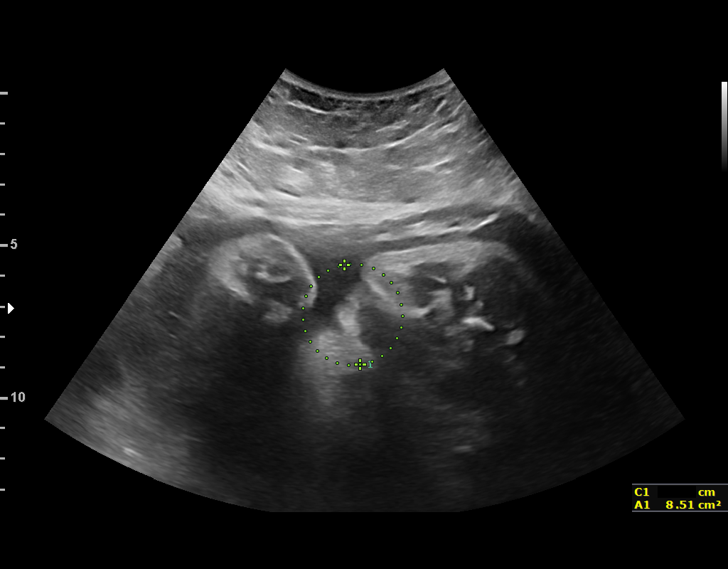
[im 25/36]
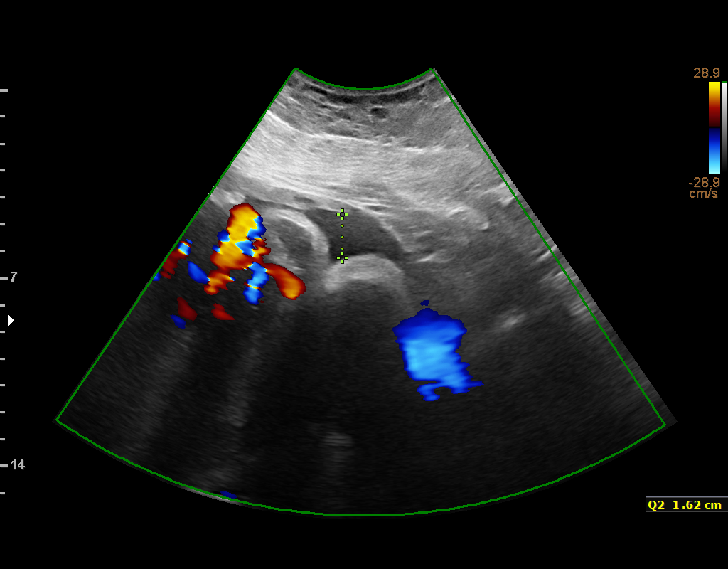
[im 29/36]
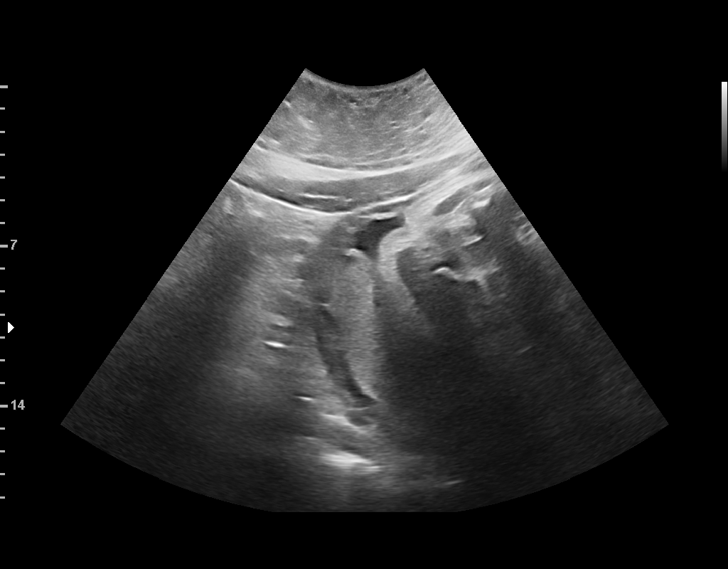
[im 32/36]
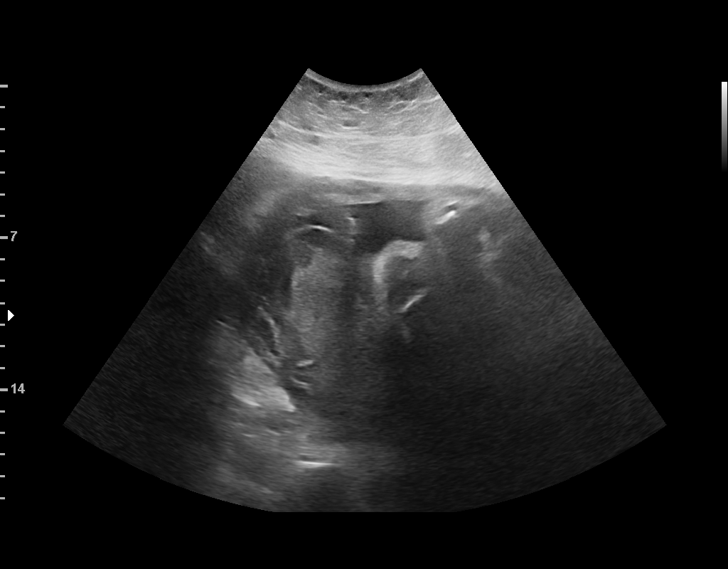
[im 34/36]
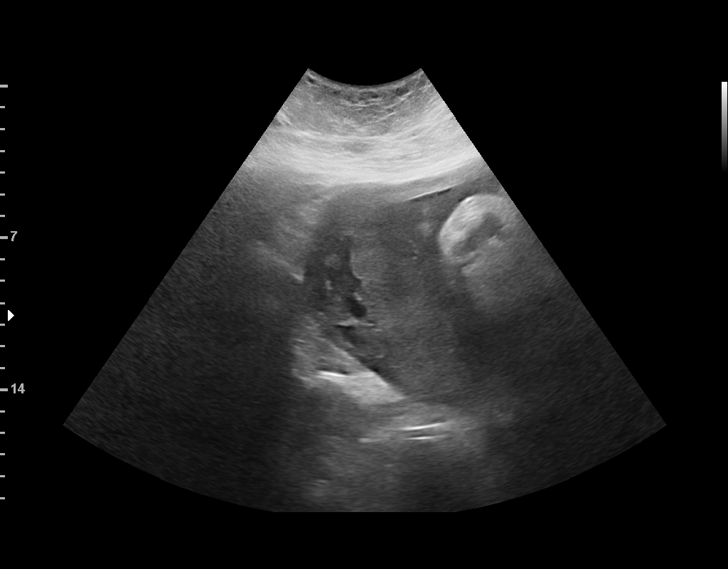

[12 of 28 positions shown; findings below may reference images not displayed]

1  CHAMPION COLCHADO           [id number]      [id number]     [id number]
Indications

33 weeks gestation of pregnancy
Antenatal follow-up for nonvisualized fetal
anatomy
Late to prenatal care, third trimester
Maternal morbid obesity
OB History

Blood Type:            Height:  5'6"   Weight (lb):  275       BMI:
Gravidity:    1
Fetal Evaluation

Num Of Fetuses:     1
Fetal Heart         142
Rate(bpm):
Cardiac Activity:   Observed
Presentation:       Cephalic
Placenta:           Posterior
P. Cord Insertion:  Previously Visualized

Amniotic Fluid
AFI FV:      Within normal limits

AFI Sum(cm)     %Tile       Largest Pocket(cm)
8.02            5

RUQ(cm)       RLQ(cm)       LUQ(cm)        LLQ(cm)
1.73
Biometry
BPD:      82.3  mm     G. Age:  33w 1d         31  %    CI:        76.15   %    70 - 86
FL/HC:      23.0   %    19.4 -
HC:      298.9  mm     G. Age:  33w 1d          9  %    HC/AC:      0.92        0.96 -
AC:      325.2  mm     G. Age:  36w 3d       > 97  %    FL/BPD:     83.5   %    71 - 87
FL:       68.7  mm     G. Age:  35w 2d         81  %    FL/AC:      21.1   %    20 - 24
HUM:        60  mm     G. Age:  34w 6d         82  %

Est. FW:    5661  gm    5 lb 14 oz      85  %
Gestational Age

LMP:           33w 4d        Date:  09/12/17                 EDD:   06/19/18
U/S Today:     34w 4d                                        EDD:   06/12/18
Best:          33w 4d     Det. By:  LMP  (09/12/17)          EDD:   06/19/18
Anatomy

Cranium:               Appears normal         Aortic Arch:            Not well visualized
Cavum:                 Previously seen        Ductal Arch:            Not well visualized
Ventricles:            Previously seen        Diaphragm:              Appears normal
Choroid Plexus:        Previously seen        Stomach:                Appears normal, left
sided
Cerebellum:            Previously seen        Abdomen:                Previously seen
Posterior Fossa:       Previously seen        Abdominal Wall:         Previously seen
Nuchal Fold:           Not applicable (>20    Cord Vessels:           Previously seen
wks GA)
Face:                  Orbits prev seen       Kidneys:                Appear normal
;profile nwv
Lips:                  Previously seen        Bladder:                Appears normal
Thoracic:              Appears normal         Spine:                  Previously seen
Heart:                 Previously seen        Upper Extremities:      Previously seen
RVOT:                  Not well visualized    Lower Extremities:      Previously seen
LVOT:                  Previously seen

Other:  Fetus appears to be a female. Heels and LT 5th digit prev visualized.
Technicallly difficult due to advanced GA and maternal habitus and
fetal position.
Cervix Uterus Adnexa

Cervix
Not visualized (advanced GA >53wks)

Uterus
No abnormality visualized.

Left Ovary
Not visualized.

Right Ovary
Not visualized.
Impression

Amniotic fluid is normal and good fetal activity is seen. Fetal
growth is appropriate for gestational age.
Fetal anatomical survey could not be completed because of
advanced gestational age and maternal body habitus.

Maternal obesity imposes limitations on the resolution of
images, and failure to detect fetal anomalies is more common
in obese pregnant women. As maternal obesity makes
clinical assessment of fetal growth difficult, we recommend
serial growth scans until delivery.
Recommendations

Follow-up scans as clinically indicated.

## 2019-08-08 ENCOUNTER — Other Ambulatory Visit: Payer: Self-pay

## 2019-08-08 ENCOUNTER — Emergency Department (HOSPITAL_COMMUNITY)
Admission: EM | Admit: 2019-08-08 | Discharge: 2019-08-08 | Disposition: A | Discharge status: Home / Self Care | Payer: Medicaid Other | Attending: Emergency Medicine | Admitting: Emergency Medicine

## 2019-08-08 DIAGNOSIS — Z5321 Procedure and treatment not carried out due to patient leaving prior to being seen by health care provider: Secondary | ICD-10-CM | POA: Insufficient documentation

## 2019-08-08 DIAGNOSIS — R05 Cough: Secondary | ICD-10-CM | POA: Insufficient documentation

## 2019-08-08 NOTE — ED Triage Notes (Signed)
Dry coughing. Sneezing, sore throat. Complains of chest discomfort secondary to coughing. No fevers although does state feeling hot.Taking tylenol as needed.  Symptoms started 3 days ago.

## 2019-08-13 IMAGING — US US MFM OB FOLLOW-UP
1 series · 14 of 28 positions shown · non-contrast
Comparison: none

[Series 1: us mfm ob follow-up · 14 of 34 slices shown]
[im 2/34]
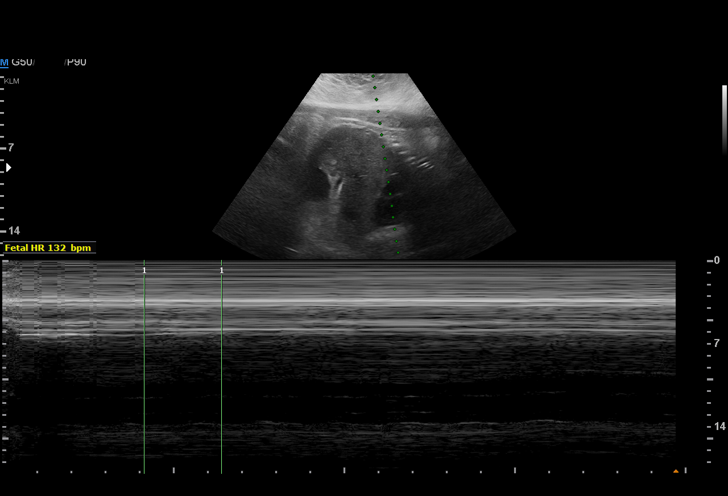
[im 4/34]
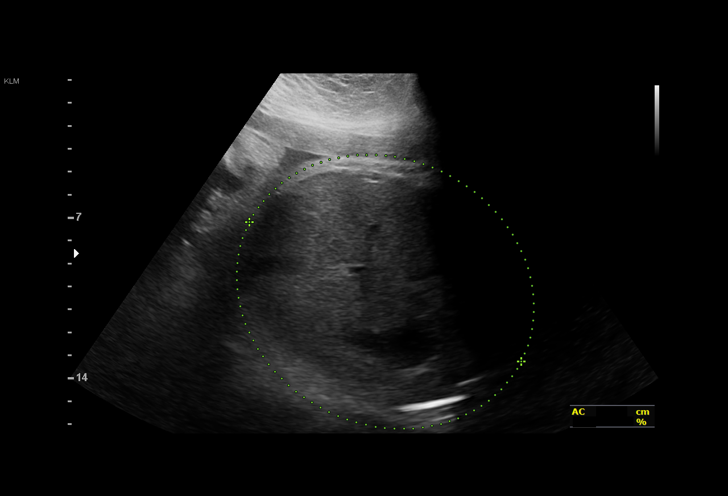
[im 7/34]
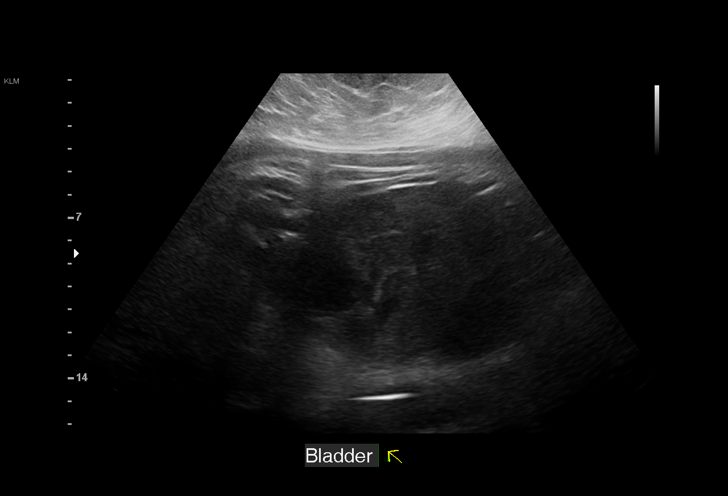
[im 9/34]
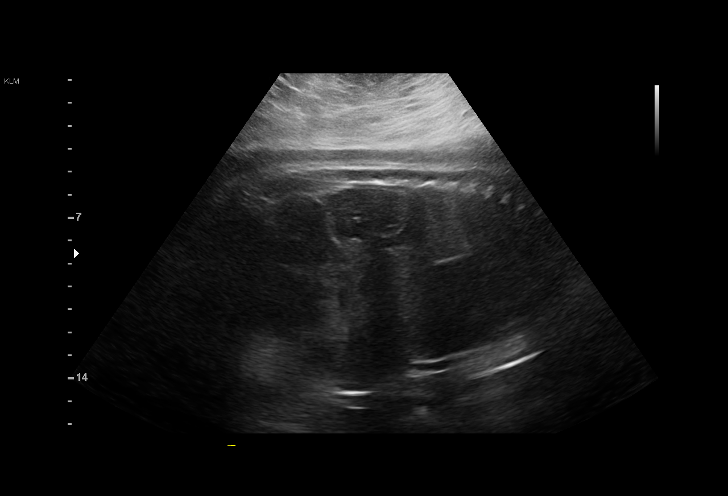
[im 12/34]
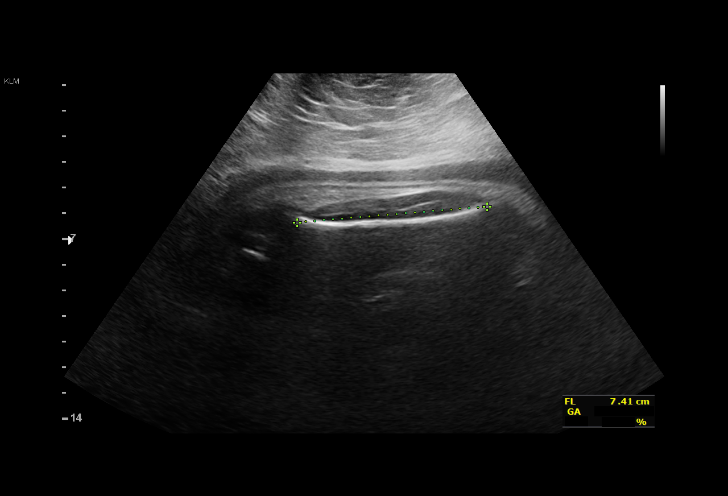
[im 14/34]
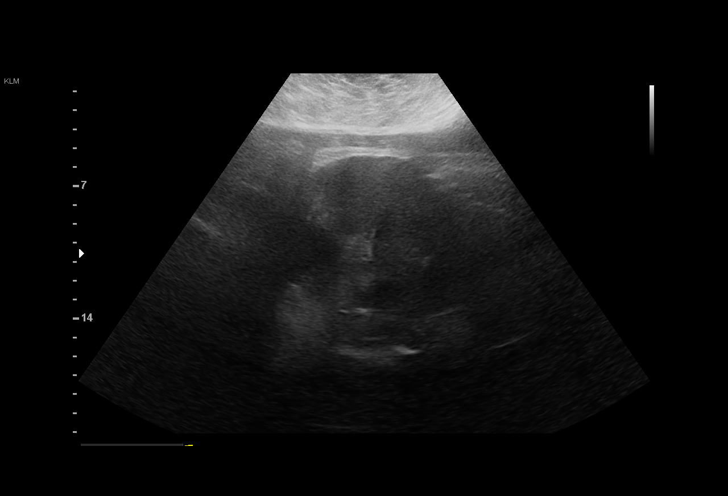
[im 16/34]
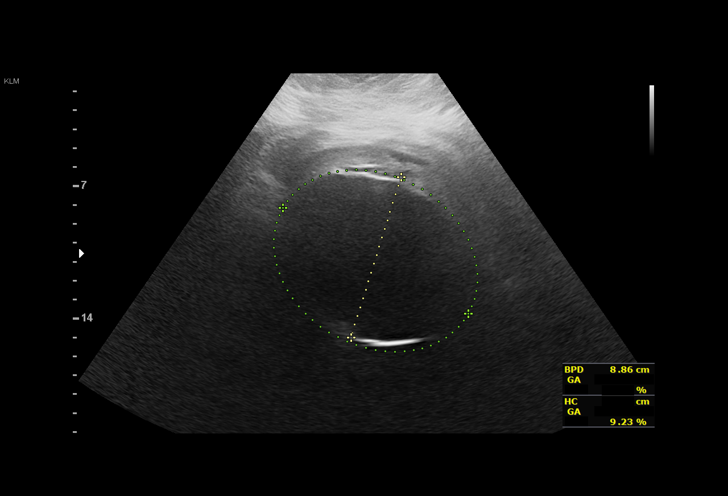
[im 19/34]
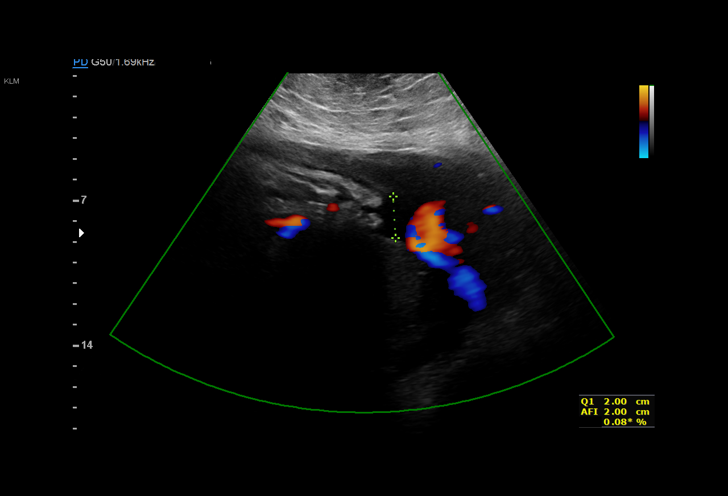
[im 21/34]
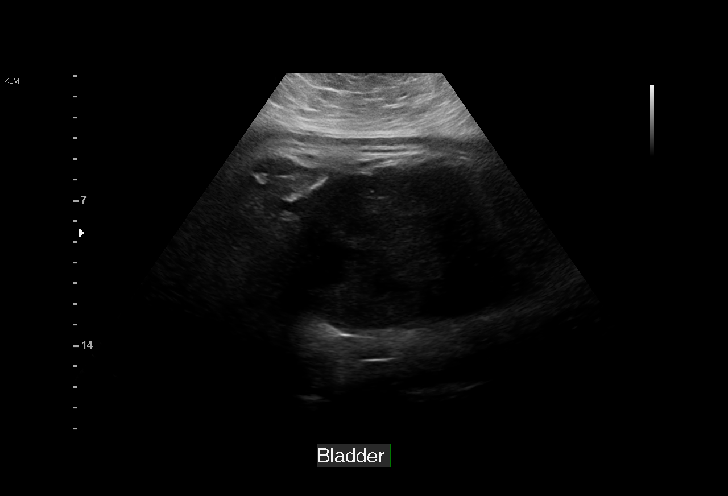
[im 24/34]
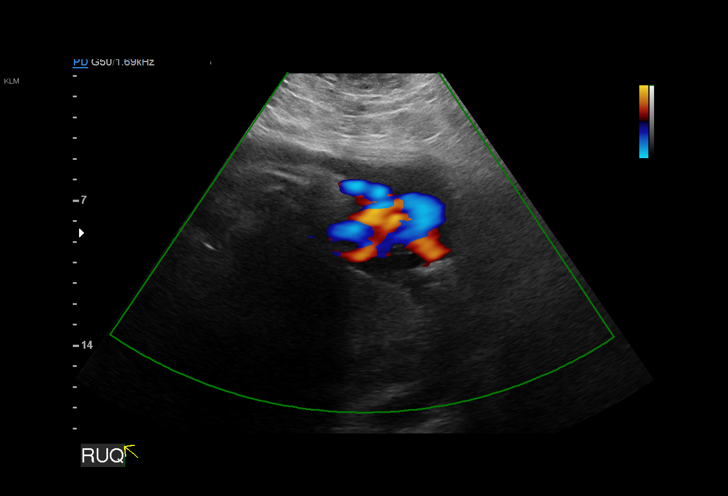
[im 26/34]
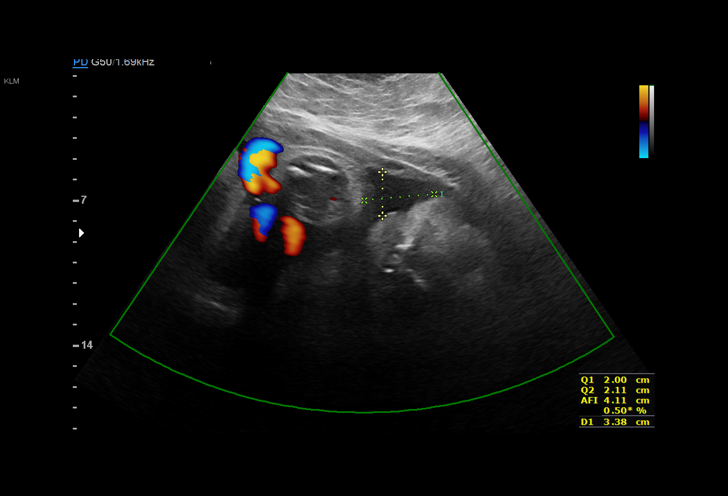
[im 29/34]
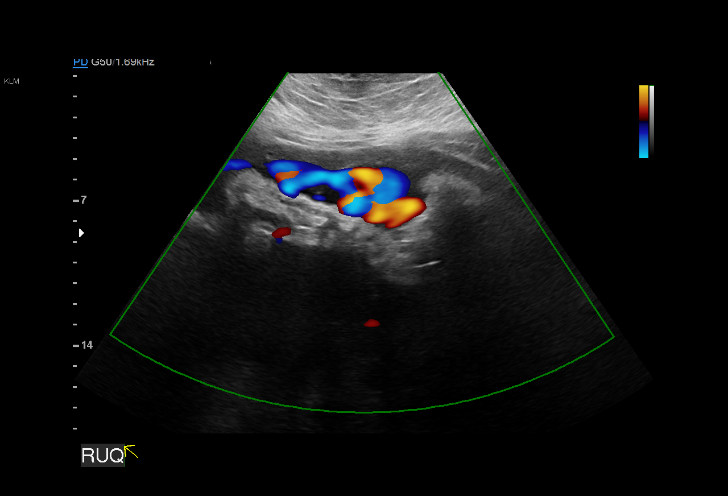
[im 31/34]
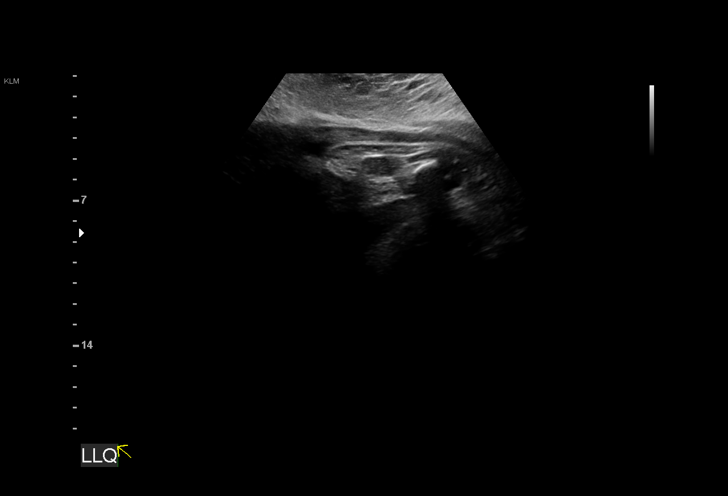
[im 34/34]
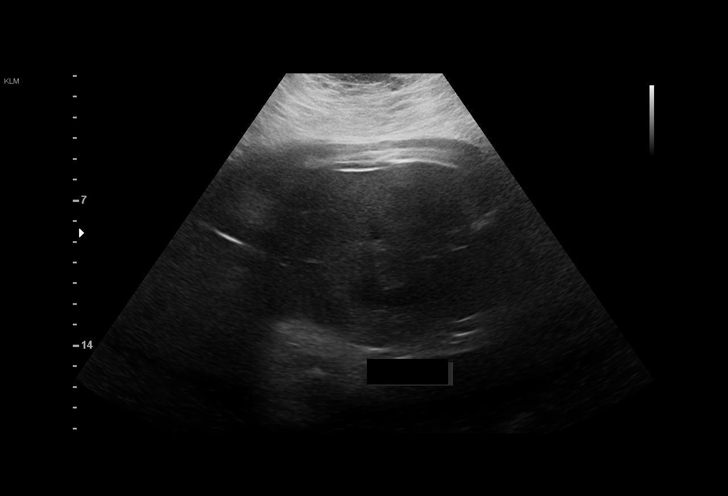

[14 of 28 positions shown; findings below may reference images not displayed]

Indications

Uterine size-date discrepancy, third trimester
37 weeks gestation of pregnancy
Antenatal follow-up for nonvisualized fetal
anatomy
Late to prenatal care, third trimester
Maternal morbid obesity
Oligohydraminios, third trimester, unspecified
Vital Signs

Height:        5'6"
Fetal Evaluation

Num Of Fetuses:         1
Fetal Heart Rate(bpm):  132
Cardiac Activity:       Observed
Presentation:           Cephalic
Placenta:               Posterior
P. Cord Insertion:      Previously Visualized

Amniotic Fluid
AFI FV:      Oligohydramnios

AFI Sum(cm)     %Tile       Largest Pocket(cm)
2.1             < 3

RLQ(cm)
2.1
Biophysical Evaluation

Amniotic F.V:   Pocket => 2 cm two         F. Tone:        Observed
planes
F. Movement:    Observed                   Score:          [DATE]
F. Breathing:   Observed
Biometry

BPD:      86.9  mm     G. Age:  35w 0d         15  %    CI:        75.47   %    70 - 86
FL/HC:      23.5   %    20.8 -
HC:      317.2  mm     G. Age:  35w 4d          6  %    HC/AC:      0.85        0.92 -
AC:      374.7  mm     G. Age:  41w 3d       > 97  %    FL/BPD:     85.7   %    71 - 87
FL:       74.5  mm     G. Age:  38w 1d         76  %    FL/AC:      19.9   %    20 - 24

Est. FW:    1315  gm      8 lb 3 oz   > 90  %
OB History

Gravidity:    1
Gestational Age

LMP:           37w 0d        Date:  09/12/17                 EDD:   06/19/18
U/S Today:     37w 4d                                        EDD:   06/15/18
Best:          37w 0d     Det. By:  LMP  (09/12/17)          EDD:   06/19/18
Anatomy

Cranium:               Appears normal         Aortic Arch:            Not well visualized
Cavum:                 Previously seen        Ductal Arch:            Not well visualized
Ventricles:            Previously seen        Diaphragm:              Previously seen
Choroid Plexus:        Previously seen        Stomach:                Appears normal, left
sided
Cerebellum:            Previously seen        Abdomen:                Previously seen
Posterior Fossa:       Previously seen        Abdominal Wall:         Previously seen
Nuchal Fold:           Not applicable (>20    Cord Vessels:           Previously seen
wks GA)
Face:                  Orbits prev seen       Kidneys:                Appear normal
;profile nwv
Lips:                  Previously seen        Bladder:                Appears normal
Thoracic:              Appears normal         Spine:                  Previously seen
Heart:                 Previously seen        Upper Extremities:      Previously seen
RVOT:                  Not well visualized    Lower Extremities:      Previously seen
LVOT:                  Previously seen

Other:  Fetus appears to be a female. Heels and LT 5th digit prev visualized.
Technicallly difficult due to advanced GA and maternal habitus and
fetal position.
Cervix Uterus Adnexa

Cervix
Not visualized (advanced GA >21wks)
Impression

Uterine-size-date discrepancy.

Oligohydramnios is seen (AFI=2 cm). A single deep vertical
pocket of amniotic fluid (2 cm) is seen.  The estimated fetal
weight is at greater than the 90th percentile. Abdominal
circumference measures at greater than the 95th percentile.
Antenatal testing is reassuring. BPP [DATE].
Called [facility] and discussed with Carmine Samb, NP.
Recommendations

Patient was taken over to the [facility] for further management.

## 2022-03-07 ENCOUNTER — Emergency Department (HOSPITAL_COMMUNITY)
Admission: EM | Admit: 2022-03-07 | Discharge: 2022-03-07 | Disposition: A | Discharge status: Home / Self Care | Payer: BC Managed Care – PPO | Attending: Emergency Medicine | Admitting: Emergency Medicine

## 2022-03-07 ENCOUNTER — Encounter (HOSPITAL_COMMUNITY): Payer: Self-pay

## 2022-03-07 ENCOUNTER — Other Ambulatory Visit: Payer: Self-pay

## 2022-03-07 DIAGNOSIS — X58XXXA Exposure to other specified factors, initial encounter: Secondary | ICD-10-CM | POA: Insufficient documentation

## 2022-03-07 DIAGNOSIS — H6691 Otitis media, unspecified, right ear: Secondary | ICD-10-CM | POA: Diagnosis not present

## 2022-03-07 DIAGNOSIS — S01312A Laceration without foreign body of left ear, initial encounter: Secondary | ICD-10-CM | POA: Diagnosis not present

## 2022-03-07 DIAGNOSIS — H6690 Otitis media, unspecified, unspecified ear: Secondary | ICD-10-CM

## 2022-03-07 DIAGNOSIS — J029 Acute pharyngitis, unspecified: Secondary | ICD-10-CM | POA: Diagnosis present

## 2022-03-07 LAB — GROUP A STREP BY PCR: Group A Strep by PCR: NOT DETECTED

## 2022-03-07 MED ORDER — LIDOCAINE VISCOUS HCL 2 % MT SOLN
15.0000 mL | Freq: Once | OROMUCOSAL | Status: AC
  Administered 2022-03-07: 15 mL via OROMUCOSAL
  Filled 2022-03-07: qty 15

## 2022-03-07 MED ORDER — ACETAMINOPHEN 500 MG PO TABS
1000.0000 mg | ORAL_TABLET | Freq: Once | ORAL | Status: AC
  Administered 2022-03-07: 1000 mg via ORAL
  Filled 2022-03-07: qty 2

## 2022-03-07 MED ORDER — AMOXICILLIN-POT CLAVULANATE 875-125 MG PO TABS: 1.0000 | ORAL_TABLET | Freq: Two times a day (BID) | ORAL | 0 refills | Status: AC

## 2022-03-07 MED ORDER — LIDOCAINE VISCOUS HCL 2 % MT SOLN: 15.0000 mL | Freq: Four times a day (QID) | OROMUCOSAL | 0 refills | Status: AC | PRN

## 2022-03-07 NOTE — ED Triage Notes (Signed)
Pt arrived POV from home c/o a sore throat and ringing in her ears. Pt states the sore throat started 4 days ago and the ringing in the ears started this morning.

## 2022-03-07 NOTE — Discharge Instructions (Addendum)
You came to the emergency department today to be evaluated for your sore throat and right ear pain.  Your strep test was negative.  Your physical exam showed concerns for a right ear infection.  Due to this she was started on the antibiotic Augmentin.  Please take this medication as prescribed.  Please follow-up with your primary care doctor if your symptoms do not improve.  Please take Ibuprofen (Advil, motrin) and Tylenol (acetaminophen) to relieve your pain.    You may take up to 600 MG (3 pills) of normal strength ibuprofen every 8 hours as needed.   You make take tylenol, up to 1,000 mg (two extra strength pills) every 8 hours as needed.   It is safe to take ibuprofen and tylenol at the same time as they work differently.   Do not take more than 3,000 mg tylenol in a 24 hour period (not more than one dose every 8 hours.  Please check all medication labels as many medications such as pain and cold medications may contain tylenol.  Do not drink alcohol while taking these medications.  Do not take other NSAID'S while taking ibuprofen (such as aleve or naproxen).  Please take ibuprofen with food to decrease stomach upset.  You may have diarrhea from the antibiotics.  It is very important that you continue to take the antibiotics even if you get diarrhea unless a medical professional tells you that you may stop taking them.  If you stop too early the bacteria you are being treated for will become stronger and you may need different, more powerful antibiotics that have more side effects and worsening diarrhea.  Please stay well hydrated and consider probiotics as they may decrease the severity of your diarrhea.

## 2022-03-07 NOTE — ED Provider Notes (Signed)
[facility]   CSN: [id number] Arrival date & time: 03/07/22  [id number]     History  Chief Complaint  Patient presents with   Tinnitus   Sore Throat    Judy Collins is a 31 y.o. female with no pertinent past medical history.  Presents the emergency department with a chief complaint of sore throat and right ear otalgia.  Patient reports that she recently was treated for bacterial conjunctivitis.  After conjunctivitis resolved she started having a sore throat.  Sore throat has been present over the last 4 days.  Pain is worse with p.o. intake.    Patient reports that her right ear otalgia started this morning.  Pain has been constant since onset.  Patient also reports tinnitus to right ear as well.  Patient tried cleaning her ears with a Q-tip with no improvement in her symptoms.  Patient denies any aspirin use.  Patient reports that she had chills on the first day of her sore throat however has not had any since.  Patient denies any fever, drooling, trismus, hot potato voice, trouble swallowing, neck pain, neck stiffness, shortness of breath, cough.   Sore Throat Pertinent negatives include no abdominal pain, no headaches and no shortness of breath.       Home Medications Prior to Admission medications   Medication Sig Start Date End Date Taking? Authorizing Provider  acetaminophen (TYLENOL) 325 MG tablet Take 2 tablets (650 mg total) by mouth every 4 (four) hours as needed (for pain scale < 4). Patient not taking: Reported on 06/27/2018 06/01/18   [place] BingPickens, Charlie, MD  amLODipine (NORVASC) 10 MG tablet Take 1 tablet (10 mg total) by mouth daily. Patient not taking: Reported on 06/27/2018 06/03/18   Raelyn Moraawson, Rolitta, CNM  ferrous sulfate 325 (65 FE) MG tablet Take 1 tablet (325 mg total) by mouth daily. Patient not taking: Reported on 06/27/2018 05/21/18   Lesly DukesLeggett, Clair[place] H, MD  ibuprofen (ADVIL,MOTRIN) 600 MG tablet Take 1 tablet (600 mg  total) by mouth every 6 (six) hours. Patient not taking: Reported on 06/27/2018 06/01/18   Hana BingPickens, Charlie, MD  Prenatal Vit-Fe Fumarate-FA (MULTIVITAMIN-PRENATAL) 27-0.8 MG TABS tablet Take 1 tablet by mouth daily at 12 noon.    [provider]      Allergies    Patient has no known allergies.    Review of Systems   Review of Systems  Constitutional:  Positive for chills. Negative for fever.  HENT:  Positive for congestion, ear pain and rhinorrhea. Negative for drooling, ear discharge, facial swelling, trouble swallowing and voice change.   Respiratory:  Negative for cough and shortness of breath.   Gastrointestinal:  Negative for abdominal pain, diarrhea, nausea and vomiting.  Musculoskeletal:  Negative for neck pain and neck stiffness.  Skin:  Negative for color change and rash.  Neurological:  Negative for dizziness, syncope, light-headedness and headaches.  Psychiatric/Behavioral:  Negative for confusion.     Physical Exam Updated Vital Signs BP (!) 155/114 (BP Location: Right Arm)   Pulse (!) 107   Temp 98.3 F (36.8 C)   Resp 17   Ht 5\' 7"  (1.702 m)   Wt (!) 137.4 kg   SpO2 99%   BMI 47.46 kg/m  Physical Exam Vitals and nursing note reviewed.  Constitutional:      General: She is not in acute distress.    Appearance: She is not ill-appearing, toxic-appearing or diaphoretic.  HENT:     Head: Normocephalic.  Jaw: There is normal jaw occlusion. No trismus, tenderness, swelling, pain on movement or malocclusion.     Right Ear: Ear canal and external ear normal. No laceration, drainage, swelling or tenderness. No mastoid tenderness. Tympanic membrane is injected, erythematous and bulging. Tympanic membrane is not scarred, perforated or retracted.     Left Ear: Tympanic membrane and external ear normal. Laceration present. No mastoid tenderness. Tympanic membrane is not injected, scarred, perforated, erythematous, retracted or bulging.     Ears:     Comments: No  auricle proptosis bilaterally.  Small amount of dried blood within the left ear canal, no active bleeding noted.  Right TM is erythematous and bulging.     Mouth/Throat:     Lips: Pink. No lesions.     Mouth: Mucous membranes are moist.     Tongue: No lesions. Tongue does not deviate from midline.     Palate: No mass and lesions.     Pharynx: Oropharynx is clear. Uvula midline. No pharyngeal swelling, oropharyngeal exudate, posterior oropharyngeal erythema or uvula swelling.     Tonsils: No tonsillar exudate or tonsillar abscesses. 1+ on the right. 1+ on the left.     Comments: No swelling to floor of mouth.  Handles oral secretions without difficulty Eyes:     General: No scleral icterus.       Right eye: No discharge.        Left eye: No discharge.  Neck:     Comments: No swelling to submandibular space Cardiovascular:     Rate and Rhythm: Normal rate.  Pulmonary:     Effort: Pulmonary effort is normal.  Musculoskeletal:     Cervical back: Full passive range of motion without pain, normal range of motion and neck supple. No edema, erythema, signs of trauma, rigidity, torticollis or crepitus. No pain with movement, spinous process tenderness or muscular tenderness. Normal range of motion.  Lymphadenopathy:     Cervical: No cervical adenopathy.  Skin:    General: Skin is warm and dry.  Neurological:     General: No focal deficit present.     Mental Status: She is alert.     GCS: GCS eye subscore is 4. GCS verbal subscore is 5. GCS motor subscore is 6.  Psychiatric:        Behavior: Behavior is cooperative.     ED Results / Procedures / Treatments   Labs (all labs ordered are listed, but only abnormal results are displayed) Labs Reviewed  GROUP A STREP BY PCR    EKG None  Radiology No results found.  Procedures Procedures    Medications Ordered in ED Medications  acetaminophen (TYLENOL) tablet 1,000 mg (has no administration in time range)  lidocaine (XYLOCAINE)  2 % viscous mouth solution 15 mL (has no administration in time range)    ED Course/ Medical Decision Making/ A&P                           Medical Decision Making Risk OTC drugs. Prescription drug management.   Alert 31 year old female in no acute distress, nontoxic-appearing.  Presents to the ED with a chief complaint of sore throat and right ear otalgia.  Information obtained from patient.  Past medical records were reviewed including previous prior notes and labs.  Due to reports of sore throat differential includes but is not limited to strep pharyngitis, viral pharyngitis, tonsillitis, Ludewig's angina, deep space neck infection, postnasal drip, URI.  Due to  reports of right ear otalgia differential includes but is not limited to acute otitis media, otitis externa, mastoiditis.  The reports of sore throat strep testing was obtained.  I personally viewed and interpreted patient's lab results.  Patient was negative for group A strep.  Low suspicion for Ludewig's angina or deep space neck infection as patient has no swelling to submandibular space, floor of mouth, or face.  Handles oral secretions without difficulty, no pain with passive range of motion of her neck.  No erythema or swelling noted to tonsils bilaterally or oropharynx.  Suspect that patient's pain is postnasal drip versus viral pharyngitis.  We will give patient viscous lidocaine for pain management.  Low suspicion for mastoiditis as patient has no auricle proptosis or mastoid tenderness bilaterally.  Patient noted to have signs consistent with acute otitis media to right ear.  We will start patient on Augmentin.  Discussed symptomatic treatment with Tylenol and ibuprofen.  We will discharge patient at this time.  Patient to follow-up with PCP if symptoms not improved.  Based on patient's chief complaint, I considered admission might be necessary, however after reassuring ED workup feel patient is reasonable for discharge.   Discussed results, findings, treatment and follow up. Patient advised of return precautions. Patient verbalized understanding and agreed with plan.  Portions of this note were generated with Scientist, clinical (histocompatibility and immunogenetics). Dictation errors may occur despite best attempts at proofreading.         Final Clinical Impression(s) / ED Diagnoses Final diagnoses:  Acute otitis media, unspecified otitis media type  Sore throat    Rx / DC Orders ED Discharge Orders          Ordered    lidocaine (XYLOCAINE) 2 % solution  Every 6 hours PRN        03/07/22 1044    amoxicillin-clavulanate (AUGMENTIN) 875-125 MG tablet  Every 12 hours        03/07/22 1044              Haskel Schroeder, PA-C 03/07/22 1054    Alvira Monday, MD 03/08/22 [id number]
